# Patient Record
Sex: Male | Born: 1970 | Hispanic: Yes | Marital: Married | Smoking: Former smoker
Health system: Southern US, Community
[De-identification: ages and names within clinical notes are randomized; demographics above are authoritative.]

## PROBLEM LIST (undated history)

## (undated) DIAGNOSIS — M549 Dorsalgia, unspecified: Secondary | ICD-10-CM

## (undated) DIAGNOSIS — G8929 Other chronic pain: Secondary | ICD-10-CM

## (undated) DIAGNOSIS — R519 Headache, unspecified: Secondary | ICD-10-CM

## (undated) DIAGNOSIS — E162 Hypoglycemia, unspecified: Secondary | ICD-10-CM

## (undated) DIAGNOSIS — K635 Polyp of colon: Secondary | ICD-10-CM

## (undated) DIAGNOSIS — M797 Fibromyalgia: Secondary | ICD-10-CM

## (undated) DIAGNOSIS — G4730 Sleep apnea, unspecified: Secondary | ICD-10-CM

## (undated) DIAGNOSIS — Z86718 Personal history of other venous thrombosis and embolism: Secondary | ICD-10-CM

## (undated) DIAGNOSIS — M1990 Unspecified osteoarthritis, unspecified site: Secondary | ICD-10-CM

## (undated) DIAGNOSIS — R51 Headache: Secondary | ICD-10-CM

## (undated) DIAGNOSIS — F419 Anxiety disorder, unspecified: Secondary | ICD-10-CM

## (undated) HISTORY — DX: Polyp of colon: K63.5

## (undated) HISTORY — DX: Fibromyalgia: M79.7

## (undated) HISTORY — PX: THYROID SURGERY: SHX805

## (undated) HISTORY — PX: MENISCUS REPAIR: SHX5179

## (undated) HISTORY — DX: Other chronic pain: G89.29

## (undated) HISTORY — DX: Dorsalgia, unspecified: M54.9

## (undated) HISTORY — DX: Sleep apnea, unspecified: G47.30

## (undated) HISTORY — DX: Headache, unspecified: R51.9

## (undated) HISTORY — DX: Unspecified osteoarthritis, unspecified site: M19.90

## (undated) HISTORY — DX: Anxiety disorder, unspecified: F41.9

## (undated) HISTORY — DX: Headache: R51

## (undated) HISTORY — PX: VEIN SURGERY: SHX48

## (undated) HISTORY — DX: Hypoglycemia, unspecified: E16.2

## (undated) HISTORY — DX: Personal history of other venous thrombosis and embolism: Z86.718

---

## 2011-10-18 HISTORY — PX: GASTRIC BYPASS: SHX52

## 2014-01-17 DIAGNOSIS — M797 Fibromyalgia: Secondary | ICD-10-CM | POA: Insufficient documentation

## 2016-01-26 DIAGNOSIS — M5126 Other intervertebral disc displacement, lumbar region: Secondary | ICD-10-CM | POA: Diagnosis not present

## 2016-01-26 DIAGNOSIS — M461 Sacroiliitis, not elsewhere classified: Secondary | ICD-10-CM | POA: Diagnosis not present

## 2016-01-26 DIAGNOSIS — M5116 Intervertebral disc disorders with radiculopathy, lumbar region: Secondary | ICD-10-CM | POA: Diagnosis not present

## 2016-01-26 DIAGNOSIS — M5440 Lumbago with sciatica, unspecified side: Secondary | ICD-10-CM | POA: Diagnosis not present

## 2016-01-26 DIAGNOSIS — M5431 Sciatica, right side: Secondary | ICD-10-CM | POA: Diagnosis not present

## 2016-01-26 DIAGNOSIS — M545 Low back pain: Secondary | ICD-10-CM | POA: Diagnosis not present

## 2016-01-26 DIAGNOSIS — M5416 Radiculopathy, lumbar region: Secondary | ICD-10-CM | POA: Diagnosis not present

## 2016-01-26 DIAGNOSIS — G2581 Restless legs syndrome: Secondary | ICD-10-CM | POA: Diagnosis not present

## 2016-01-26 DIAGNOSIS — G43719 Chronic migraine without aura, intractable, without status migrainosus: Secondary | ICD-10-CM | POA: Diagnosis not present

## 2016-01-26 DIAGNOSIS — M47897 Other spondylosis, lumbosacral region: Secondary | ICD-10-CM | POA: Diagnosis not present

## 2016-01-26 DIAGNOSIS — M4807 Spinal stenosis, lumbosacral region: Secondary | ICD-10-CM | POA: Diagnosis not present

## 2016-02-11 DIAGNOSIS — M5126 Other intervertebral disc displacement, lumbar region: Secondary | ICD-10-CM | POA: Diagnosis not present

## 2016-02-11 DIAGNOSIS — M5440 Lumbago with sciatica, unspecified side: Secondary | ICD-10-CM | POA: Diagnosis not present

## 2016-02-11 DIAGNOSIS — M5116 Intervertebral disc disorders with radiculopathy, lumbar region: Secondary | ICD-10-CM | POA: Diagnosis not present

## 2016-02-11 DIAGNOSIS — G43909 Migraine, unspecified, not intractable, without status migrainosus: Secondary | ICD-10-CM | POA: Diagnosis not present

## 2016-02-11 DIAGNOSIS — M545 Low back pain: Secondary | ICD-10-CM | POA: Diagnosis not present

## 2016-02-11 DIAGNOSIS — M5416 Radiculopathy, lumbar region: Secondary | ICD-10-CM | POA: Diagnosis not present

## 2016-02-25 DIAGNOSIS — M5116 Intervertebral disc disorders with radiculopathy, lumbar region: Secondary | ICD-10-CM | POA: Diagnosis not present

## 2016-02-25 DIAGNOSIS — M5126 Other intervertebral disc displacement, lumbar region: Secondary | ICD-10-CM | POA: Diagnosis not present

## 2016-02-25 DIAGNOSIS — M5440 Lumbago with sciatica, unspecified side: Secondary | ICD-10-CM | POA: Diagnosis not present

## 2016-02-25 DIAGNOSIS — G43719 Chronic migraine without aura, intractable, without status migrainosus: Secondary | ICD-10-CM | POA: Diagnosis not present

## 2016-02-25 DIAGNOSIS — M47897 Other spondylosis, lumbosacral region: Secondary | ICD-10-CM | POA: Diagnosis not present

## 2016-02-25 DIAGNOSIS — M4807 Spinal stenosis, lumbosacral region: Secondary | ICD-10-CM | POA: Diagnosis not present

## 2016-02-25 DIAGNOSIS — G2581 Restless legs syndrome: Secondary | ICD-10-CM | POA: Diagnosis not present

## 2016-02-25 DIAGNOSIS — M545 Low back pain: Secondary | ICD-10-CM | POA: Diagnosis not present

## 2016-02-25 DIAGNOSIS — M5416 Radiculopathy, lumbar region: Secondary | ICD-10-CM | POA: Diagnosis not present

## 2016-02-25 DIAGNOSIS — M5431 Sciatica, right side: Secondary | ICD-10-CM | POA: Diagnosis not present

## 2016-02-25 DIAGNOSIS — M461 Sacroiliitis, not elsewhere classified: Secondary | ICD-10-CM | POA: Diagnosis not present

## 2016-03-11 DIAGNOSIS — Z86718 Personal history of other venous thrombosis and embolism: Secondary | ICD-10-CM | POA: Diagnosis not present

## 2016-03-11 DIAGNOSIS — Z8673 Personal history of transient ischemic attack (TIA), and cerebral infarction without residual deficits: Secondary | ICD-10-CM | POA: Diagnosis not present

## 2016-03-11 DIAGNOSIS — Z9884 Bariatric surgery status: Secondary | ICD-10-CM | POA: Diagnosis not present

## 2016-03-11 DIAGNOSIS — Z87891 Personal history of nicotine dependence: Secondary | ICD-10-CM | POA: Diagnosis not present

## 2016-03-11 DIAGNOSIS — E890 Postprocedural hypothyroidism: Secondary | ICD-10-CM | POA: Diagnosis not present

## 2016-03-11 DIAGNOSIS — E162 Hypoglycemia, unspecified: Secondary | ICD-10-CM | POA: Diagnosis not present

## 2016-03-17 DIAGNOSIS — M542 Cervicalgia: Secondary | ICD-10-CM | POA: Diagnosis not present

## 2016-03-17 DIAGNOSIS — M50223 Other cervical disc displacement at C6-C7 level: Secondary | ICD-10-CM | POA: Diagnosis not present

## 2016-03-17 DIAGNOSIS — M50323 Other cervical disc degeneration at C6-C7 level: Secondary | ICD-10-CM | POA: Diagnosis not present

## 2016-03-17 DIAGNOSIS — M50221 Other cervical disc displacement at C4-C5 level: Secondary | ICD-10-CM | POA: Diagnosis not present

## 2016-03-17 DIAGNOSIS — M5021 Other cervical disc displacement,  high cervical region: Secondary | ICD-10-CM | POA: Diagnosis not present

## 2016-03-17 DIAGNOSIS — M545 Low back pain: Secondary | ICD-10-CM | POA: Diagnosis not present

## 2016-03-21 DIAGNOSIS — Z6841 Body Mass Index (BMI) 40.0 and over, adult: Secondary | ICD-10-CM | POA: Diagnosis not present

## 2016-03-21 DIAGNOSIS — E559 Vitamin D deficiency, unspecified: Secondary | ICD-10-CM | POA: Diagnosis not present

## 2016-03-21 DIAGNOSIS — M797 Fibromyalgia: Secondary | ICD-10-CM | POA: Diagnosis not present

## 2016-03-22 DIAGNOSIS — G43719 Chronic migraine without aura, intractable, without status migrainosus: Secondary | ICD-10-CM | POA: Diagnosis not present

## 2016-03-22 DIAGNOSIS — M545 Low back pain: Secondary | ICD-10-CM | POA: Diagnosis not present

## 2016-03-22 DIAGNOSIS — M50323 Other cervical disc degeneration at C6-C7 level: Secondary | ICD-10-CM | POA: Diagnosis not present

## 2016-03-22 DIAGNOSIS — M50123 Cervical disc disorder at C6-C7 level with radiculopathy: Secondary | ICD-10-CM | POA: Diagnosis not present

## 2016-03-22 DIAGNOSIS — M5126 Other intervertebral disc displacement, lumbar region: Secondary | ICD-10-CM | POA: Diagnosis not present

## 2016-03-22 DIAGNOSIS — M461 Sacroiliitis, not elsewhere classified: Secondary | ICD-10-CM | POA: Diagnosis not present

## 2016-03-22 DIAGNOSIS — G2581 Restless legs syndrome: Secondary | ICD-10-CM | POA: Diagnosis not present

## 2016-03-22 DIAGNOSIS — M5416 Radiculopathy, lumbar region: Secondary | ICD-10-CM | POA: Diagnosis not present

## 2016-03-22 DIAGNOSIS — M47897 Other spondylosis, lumbosacral region: Secondary | ICD-10-CM | POA: Diagnosis not present

## 2016-03-22 DIAGNOSIS — M5116 Intervertebral disc disorders with radiculopathy, lumbar region: Secondary | ICD-10-CM | POA: Diagnosis not present

## 2016-03-22 DIAGNOSIS — M5440 Lumbago with sciatica, unspecified side: Secondary | ICD-10-CM | POA: Diagnosis not present

## 2016-03-22 DIAGNOSIS — M542 Cervicalgia: Secondary | ICD-10-CM | POA: Diagnosis not present

## 2016-03-31 DIAGNOSIS — I10 Essential (primary) hypertension: Secondary | ICD-10-CM | POA: Diagnosis not present

## 2016-03-31 DIAGNOSIS — Z0000 Encounter for general adult medical examination without abnormal findings: Secondary | ICD-10-CM | POA: Diagnosis not present

## 2016-04-26 DIAGNOSIS — E669 Obesity, unspecified: Secondary | ICD-10-CM | POA: Diagnosis not present

## 2016-04-26 DIAGNOSIS — E538 Deficiency of other specified B group vitamins: Secondary | ICD-10-CM | POA: Diagnosis not present

## 2016-04-26 DIAGNOSIS — E162 Hypoglycemia, unspecified: Secondary | ICD-10-CM | POA: Diagnosis not present

## 2016-04-26 DIAGNOSIS — E539 Vitamin B deficiency, unspecified: Secondary | ICD-10-CM | POA: Diagnosis not present

## 2016-04-27 DIAGNOSIS — R7301 Impaired fasting glucose: Secondary | ICD-10-CM | POA: Diagnosis not present

## 2016-04-27 DIAGNOSIS — M79673 Pain in unspecified foot: Secondary | ICD-10-CM | POA: Diagnosis not present

## 2016-04-27 DIAGNOSIS — G2581 Restless legs syndrome: Secondary | ICD-10-CM | POA: Diagnosis not present

## 2016-04-27 DIAGNOSIS — E784 Other hyperlipidemia: Secondary | ICD-10-CM | POA: Diagnosis not present

## 2016-04-27 DIAGNOSIS — I10 Essential (primary) hypertension: Secondary | ICD-10-CM | POA: Diagnosis not present

## 2016-04-27 DIAGNOSIS — M50323 Other cervical disc degeneration at C6-C7 level: Secondary | ICD-10-CM | POA: Diagnosis not present

## 2016-04-27 DIAGNOSIS — M461 Sacroiliitis, not elsewhere classified: Secondary | ICD-10-CM | POA: Diagnosis not present

## 2016-04-27 DIAGNOSIS — M50123 Cervical disc disorder at C6-C7 level with radiculopathy: Secondary | ICD-10-CM | POA: Diagnosis not present

## 2016-04-27 DIAGNOSIS — M5126 Other intervertebral disc displacement, lumbar region: Secondary | ICD-10-CM | POA: Diagnosis not present

## 2016-04-27 DIAGNOSIS — M5440 Lumbago with sciatica, unspecified side: Secondary | ICD-10-CM | POA: Diagnosis not present

## 2016-04-27 DIAGNOSIS — M5416 Radiculopathy, lumbar region: Secondary | ICD-10-CM | POA: Diagnosis not present

## 2016-04-27 DIAGNOSIS — M545 Low back pain: Secondary | ICD-10-CM | POA: Diagnosis not present

## 2016-04-27 DIAGNOSIS — M47897 Other spondylosis, lumbosacral region: Secondary | ICD-10-CM | POA: Diagnosis not present

## 2016-04-27 DIAGNOSIS — M7731 Calcaneal spur, right foot: Secondary | ICD-10-CM | POA: Diagnosis not present

## 2016-04-27 DIAGNOSIS — M5116 Intervertebral disc disorders with radiculopathy, lumbar region: Secondary | ICD-10-CM | POA: Diagnosis not present

## 2016-04-27 DIAGNOSIS — G43719 Chronic migraine without aura, intractable, without status migrainosus: Secondary | ICD-10-CM | POA: Diagnosis not present

## 2016-04-27 DIAGNOSIS — M542 Cervicalgia: Secondary | ICD-10-CM | POA: Diagnosis not present

## 2016-04-27 DIAGNOSIS — E559 Vitamin D deficiency, unspecified: Secondary | ICD-10-CM | POA: Diagnosis not present

## 2016-04-27 DIAGNOSIS — D519 Vitamin B12 deficiency anemia, unspecified: Secondary | ICD-10-CM | POA: Diagnosis not present

## 2016-04-27 DIAGNOSIS — R5383 Other fatigue: Secondary | ICD-10-CM | POA: Diagnosis not present

## 2016-04-27 DIAGNOSIS — M7732 Calcaneal spur, left foot: Secondary | ICD-10-CM | POA: Diagnosis not present

## 2016-05-05 DIAGNOSIS — S134XXA Sprain of ligaments of cervical spine, initial encounter: Secondary | ICD-10-CM | POA: Diagnosis not present

## 2016-05-05 DIAGNOSIS — G43909 Migraine, unspecified, not intractable, without status migrainosus: Secondary | ICD-10-CM | POA: Diagnosis not present

## 2016-05-05 DIAGNOSIS — Z87828 Personal history of other (healed) physical injury and trauma: Secondary | ICD-10-CM | POA: Diagnosis not present

## 2016-05-05 DIAGNOSIS — M4722 Other spondylosis with radiculopathy, cervical region: Secondary | ICD-10-CM | POA: Diagnosis not present

## 2016-05-05 DIAGNOSIS — Z833 Family history of diabetes mellitus: Secondary | ICD-10-CM | POA: Diagnosis not present

## 2016-05-05 DIAGNOSIS — M542 Cervicalgia: Secondary | ICD-10-CM | POA: Diagnosis not present

## 2016-05-05 DIAGNOSIS — Z87891 Personal history of nicotine dependence: Secondary | ICD-10-CM | POA: Diagnosis not present

## 2016-05-05 DIAGNOSIS — M1288 Other specific arthropathies, not elsewhere classified, other specified site: Secondary | ICD-10-CM | POA: Diagnosis not present

## 2016-05-18 DIAGNOSIS — M4602 Spinal enthesopathy, cervical region: Secondary | ICD-10-CM | POA: Diagnosis not present

## 2016-05-18 DIAGNOSIS — M5481 Occipital neuralgia: Secondary | ICD-10-CM | POA: Diagnosis not present

## 2016-05-18 DIAGNOSIS — M542 Cervicalgia: Secondary | ICD-10-CM | POA: Diagnosis not present

## 2016-05-18 DIAGNOSIS — M5382 Other specified dorsopathies, cervical region: Secondary | ICD-10-CM | POA: Diagnosis not present

## 2016-05-19 DIAGNOSIS — E049 Nontoxic goiter, unspecified: Secondary | ICD-10-CM | POA: Diagnosis not present

## 2016-05-19 DIAGNOSIS — M79671 Pain in right foot: Secondary | ICD-10-CM | POA: Diagnosis not present

## 2016-05-19 DIAGNOSIS — I83813 Varicose veins of bilateral lower extremities with pain: Secondary | ICD-10-CM | POA: Diagnosis not present

## 2016-05-19 DIAGNOSIS — M797 Fibromyalgia: Secondary | ICD-10-CM | POA: Diagnosis not present

## 2016-05-19 DIAGNOSIS — E041 Nontoxic single thyroid nodule: Secondary | ICD-10-CM | POA: Diagnosis not present

## 2016-05-30 DIAGNOSIS — E049 Nontoxic goiter, unspecified: Secondary | ICD-10-CM | POA: Diagnosis not present

## 2016-05-30 DIAGNOSIS — R5383 Other fatigue: Secondary | ICD-10-CM | POA: Diagnosis not present

## 2016-06-02 DIAGNOSIS — G43719 Chronic migraine without aura, intractable, without status migrainosus: Secondary | ICD-10-CM | POA: Diagnosis not present

## 2016-06-02 DIAGNOSIS — M542 Cervicalgia: Secondary | ICD-10-CM | POA: Diagnosis not present

## 2016-06-02 DIAGNOSIS — M5416 Radiculopathy, lumbar region: Secondary | ICD-10-CM | POA: Diagnosis not present

## 2016-06-02 DIAGNOSIS — M50323 Other cervical disc degeneration at C6-C7 level: Secondary | ICD-10-CM | POA: Diagnosis not present

## 2016-06-02 DIAGNOSIS — G2581 Restless legs syndrome: Secondary | ICD-10-CM | POA: Diagnosis not present

## 2016-06-02 DIAGNOSIS — M461 Sacroiliitis, not elsewhere classified: Secondary | ICD-10-CM | POA: Diagnosis not present

## 2016-06-02 DIAGNOSIS — M545 Low back pain: Secondary | ICD-10-CM | POA: Diagnosis not present

## 2016-06-02 DIAGNOSIS — M5440 Lumbago with sciatica, unspecified side: Secondary | ICD-10-CM | POA: Diagnosis not present

## 2016-06-02 DIAGNOSIS — M50123 Cervical disc disorder at C6-C7 level with radiculopathy: Secondary | ICD-10-CM | POA: Diagnosis not present

## 2016-06-02 DIAGNOSIS — M47897 Other spondylosis, lumbosacral region: Secondary | ICD-10-CM | POA: Diagnosis not present

## 2016-06-02 DIAGNOSIS — M5126 Other intervertebral disc displacement, lumbar region: Secondary | ICD-10-CM | POA: Diagnosis not present

## 2016-06-02 DIAGNOSIS — M5116 Intervertebral disc disorders with radiculopathy, lumbar region: Secondary | ICD-10-CM | POA: Diagnosis not present

## 2016-06-16 DIAGNOSIS — M5407 Panniculitis affecting regions of neck and back, lumbosacral region: Secondary | ICD-10-CM | POA: Diagnosis not present

## 2016-06-16 DIAGNOSIS — M545 Low back pain: Secondary | ICD-10-CM | POA: Diagnosis not present

## 2016-06-16 DIAGNOSIS — S335XXA Sprain of ligaments of lumbar spine, initial encounter: Secondary | ICD-10-CM | POA: Diagnosis not present

## 2016-06-16 DIAGNOSIS — G43909 Migraine, unspecified, not intractable, without status migrainosus: Secondary | ICD-10-CM | POA: Diagnosis not present

## 2016-06-16 DIAGNOSIS — M62830 Muscle spasm of back: Secondary | ICD-10-CM | POA: Diagnosis not present

## 2016-06-16 DIAGNOSIS — M4606 Spinal enthesopathy, lumbar region: Secondary | ICD-10-CM | POA: Diagnosis not present

## 2016-06-16 DIAGNOSIS — M4607 Spinal enthesopathy, lumbosacral region: Secondary | ICD-10-CM | POA: Diagnosis not present

## 2016-06-16 DIAGNOSIS — Z87891 Personal history of nicotine dependence: Secondary | ICD-10-CM | POA: Diagnosis not present

## 2016-06-16 DIAGNOSIS — M5387 Other specified dorsopathies, lumbosacral region: Secondary | ICD-10-CM | POA: Diagnosis not present

## 2016-06-16 DIAGNOSIS — M47897 Other spondylosis, lumbosacral region: Secondary | ICD-10-CM | POA: Diagnosis not present

## 2016-06-16 DIAGNOSIS — Z833 Family history of diabetes mellitus: Secondary | ICD-10-CM | POA: Diagnosis not present

## 2016-06-16 DIAGNOSIS — M791 Myalgia: Secondary | ICD-10-CM | POA: Diagnosis not present

## 2016-06-16 DIAGNOSIS — M5386 Other specified dorsopathies, lumbar region: Secondary | ICD-10-CM | POA: Diagnosis not present

## 2016-06-20 DIAGNOSIS — M722 Plantar fascial fibromatosis: Secondary | ICD-10-CM | POA: Diagnosis not present

## 2016-06-20 DIAGNOSIS — M79672 Pain in left foot: Secondary | ICD-10-CM | POA: Diagnosis not present

## 2016-06-20 DIAGNOSIS — M79671 Pain in right foot: Secondary | ICD-10-CM | POA: Diagnosis not present

## 2016-06-20 DIAGNOSIS — M7751 Other enthesopathy of right foot: Secondary | ICD-10-CM | POA: Diagnosis not present

## 2016-06-20 DIAGNOSIS — M7752 Other enthesopathy of left foot: Secondary | ICD-10-CM | POA: Diagnosis not present

## 2016-06-20 DIAGNOSIS — R600 Localized edema: Secondary | ICD-10-CM | POA: Diagnosis not present

## 2016-06-21 DIAGNOSIS — M79672 Pain in left foot: Secondary | ICD-10-CM | POA: Diagnosis not present

## 2016-06-21 DIAGNOSIS — M79671 Pain in right foot: Secondary | ICD-10-CM | POA: Diagnosis not present

## 2016-06-21 DIAGNOSIS — M722 Plantar fascial fibromatosis: Secondary | ICD-10-CM | POA: Diagnosis not present

## 2016-06-21 DIAGNOSIS — Z7409 Other reduced mobility: Secondary | ICD-10-CM | POA: Diagnosis not present

## 2016-06-21 DIAGNOSIS — R269 Unspecified abnormalities of gait and mobility: Secondary | ICD-10-CM | POA: Diagnosis not present

## 2016-06-24 DIAGNOSIS — R269 Unspecified abnormalities of gait and mobility: Secondary | ICD-10-CM | POA: Diagnosis not present

## 2016-06-24 DIAGNOSIS — M722 Plantar fascial fibromatosis: Secondary | ICD-10-CM | POA: Diagnosis not present

## 2016-06-24 DIAGNOSIS — M79672 Pain in left foot: Secondary | ICD-10-CM | POA: Diagnosis not present

## 2016-06-24 DIAGNOSIS — M79671 Pain in right foot: Secondary | ICD-10-CM | POA: Diagnosis not present

## 2016-06-24 DIAGNOSIS — Z7409 Other reduced mobility: Secondary | ICD-10-CM | POA: Diagnosis not present

## 2016-06-29 DIAGNOSIS — I10 Essential (primary) hypertension: Secondary | ICD-10-CM | POA: Diagnosis not present

## 2016-06-29 DIAGNOSIS — E559 Vitamin D deficiency, unspecified: Secondary | ICD-10-CM | POA: Diagnosis not present

## 2016-06-30 DIAGNOSIS — M461 Sacroiliitis, not elsewhere classified: Secondary | ICD-10-CM | POA: Diagnosis not present

## 2016-06-30 DIAGNOSIS — G2581 Restless legs syndrome: Secondary | ICD-10-CM | POA: Diagnosis not present

## 2016-06-30 DIAGNOSIS — M50323 Other cervical disc degeneration at C6-C7 level: Secondary | ICD-10-CM | POA: Diagnosis not present

## 2016-06-30 DIAGNOSIS — M50123 Cervical disc disorder at C6-C7 level with radiculopathy: Secondary | ICD-10-CM | POA: Diagnosis not present

## 2016-06-30 DIAGNOSIS — M5116 Intervertebral disc disorders with radiculopathy, lumbar region: Secondary | ICD-10-CM | POA: Diagnosis not present

## 2016-06-30 DIAGNOSIS — M542 Cervicalgia: Secondary | ICD-10-CM | POA: Diagnosis not present

## 2016-06-30 DIAGNOSIS — G43719 Chronic migraine without aura, intractable, without status migrainosus: Secondary | ICD-10-CM | POA: Diagnosis not present

## 2016-06-30 DIAGNOSIS — M5126 Other intervertebral disc displacement, lumbar region: Secondary | ICD-10-CM | POA: Diagnosis not present

## 2016-06-30 DIAGNOSIS — M545 Low back pain: Secondary | ICD-10-CM | POA: Diagnosis not present

## 2016-06-30 DIAGNOSIS — R42 Dizziness and giddiness: Secondary | ICD-10-CM | POA: Diagnosis not present

## 2016-06-30 DIAGNOSIS — M47897 Other spondylosis, lumbosacral region: Secondary | ICD-10-CM | POA: Diagnosis not present

## 2016-06-30 DIAGNOSIS — M5416 Radiculopathy, lumbar region: Secondary | ICD-10-CM | POA: Diagnosis not present

## 2016-06-30 DIAGNOSIS — M5440 Lumbago with sciatica, unspecified side: Secondary | ICD-10-CM | POA: Diagnosis not present

## 2016-07-11 DIAGNOSIS — M79671 Pain in right foot: Secondary | ICD-10-CM | POA: Diagnosis not present

## 2016-07-11 DIAGNOSIS — M7751 Other enthesopathy of right foot: Secondary | ICD-10-CM | POA: Diagnosis not present

## 2016-07-11 DIAGNOSIS — M79672 Pain in left foot: Secondary | ICD-10-CM | POA: Diagnosis not present

## 2016-07-11 DIAGNOSIS — M722 Plantar fascial fibromatosis: Secondary | ICD-10-CM | POA: Diagnosis not present

## 2016-07-11 DIAGNOSIS — M7752 Other enthesopathy of left foot: Secondary | ICD-10-CM | POA: Diagnosis not present

## 2016-07-11 DIAGNOSIS — R600 Localized edema: Secondary | ICD-10-CM | POA: Diagnosis not present

## 2016-07-11 DIAGNOSIS — R262 Difficulty in walking, not elsewhere classified: Secondary | ICD-10-CM | POA: Diagnosis not present

## 2016-07-29 DIAGNOSIS — M50123 Cervical disc disorder at C6-C7 level with radiculopathy: Secondary | ICD-10-CM | POA: Diagnosis not present

## 2016-07-29 DIAGNOSIS — M545 Low back pain: Secondary | ICD-10-CM | POA: Diagnosis not present

## 2016-07-29 DIAGNOSIS — M5440 Lumbago with sciatica, unspecified side: Secondary | ICD-10-CM | POA: Diagnosis not present

## 2016-07-29 DIAGNOSIS — M461 Sacroiliitis, not elsewhere classified: Secondary | ICD-10-CM | POA: Diagnosis not present

## 2016-07-29 DIAGNOSIS — G43719 Chronic migraine without aura, intractable, without status migrainosus: Secondary | ICD-10-CM | POA: Diagnosis not present

## 2016-07-29 DIAGNOSIS — M5126 Other intervertebral disc displacement, lumbar region: Secondary | ICD-10-CM | POA: Diagnosis not present

## 2016-07-29 DIAGNOSIS — M5416 Radiculopathy, lumbar region: Secondary | ICD-10-CM | POA: Diagnosis not present

## 2016-07-29 DIAGNOSIS — G2581 Restless legs syndrome: Secondary | ICD-10-CM | POA: Diagnosis not present

## 2016-07-29 DIAGNOSIS — M50323 Other cervical disc degeneration at C6-C7 level: Secondary | ICD-10-CM | POA: Diagnosis not present

## 2016-07-29 DIAGNOSIS — M5116 Intervertebral disc disorders with radiculopathy, lumbar region: Secondary | ICD-10-CM | POA: Diagnosis not present

## 2016-07-29 DIAGNOSIS — M542 Cervicalgia: Secondary | ICD-10-CM | POA: Diagnosis not present

## 2016-07-29 DIAGNOSIS — M47897 Other spondylosis, lumbosacral region: Secondary | ICD-10-CM | POA: Diagnosis not present

## 2016-08-03 DIAGNOSIS — M5126 Other intervertebral disc displacement, lumbar region: Secondary | ICD-10-CM | POA: Diagnosis not present

## 2016-08-03 DIAGNOSIS — M545 Low back pain: Secondary | ICD-10-CM | POA: Diagnosis not present

## 2016-08-03 DIAGNOSIS — M5116 Intervertebral disc disorders with radiculopathy, lumbar region: Secondary | ICD-10-CM | POA: Diagnosis not present

## 2016-08-03 DIAGNOSIS — M5440 Lumbago with sciatica, unspecified side: Secondary | ICD-10-CM | POA: Diagnosis not present

## 2016-08-03 DIAGNOSIS — M5416 Radiculopathy, lumbar region: Secondary | ICD-10-CM | POA: Diagnosis not present

## 2016-08-08 DIAGNOSIS — Z206 Contact with and (suspected) exposure to human immunodeficiency virus [HIV]: Secondary | ICD-10-CM | POA: Diagnosis not present

## 2016-08-08 DIAGNOSIS — Z202 Contact with and (suspected) exposure to infections with a predominantly sexual mode of transmission: Secondary | ICD-10-CM | POA: Diagnosis not present

## 2016-08-11 DIAGNOSIS — Z7251 High risk heterosexual behavior: Secondary | ICD-10-CM | POA: Diagnosis not present

## 2016-08-29 DIAGNOSIS — M5126 Other intervertebral disc displacement, lumbar region: Secondary | ICD-10-CM | POA: Diagnosis not present

## 2016-08-29 DIAGNOSIS — M5431 Sciatica, right side: Secondary | ICD-10-CM | POA: Diagnosis not present

## 2016-08-29 DIAGNOSIS — M461 Sacroiliitis, not elsewhere classified: Secondary | ICD-10-CM | POA: Diagnosis not present

## 2016-08-29 DIAGNOSIS — M542 Cervicalgia: Secondary | ICD-10-CM | POA: Diagnosis not present

## 2016-08-29 DIAGNOSIS — M50123 Cervical disc disorder at C6-C7 level with radiculopathy: Secondary | ICD-10-CM | POA: Diagnosis not present

## 2016-08-29 DIAGNOSIS — M47897 Other spondylosis, lumbosacral region: Secondary | ICD-10-CM | POA: Diagnosis not present

## 2016-08-29 DIAGNOSIS — M50323 Other cervical disc degeneration at C6-C7 level: Secondary | ICD-10-CM | POA: Diagnosis not present

## 2016-08-29 DIAGNOSIS — M5416 Radiculopathy, lumbar region: Secondary | ICD-10-CM | POA: Diagnosis not present

## 2016-08-29 DIAGNOSIS — G2581 Restless legs syndrome: Secondary | ICD-10-CM | POA: Diagnosis not present

## 2016-08-29 DIAGNOSIS — G43719 Chronic migraine without aura, intractable, without status migrainosus: Secondary | ICD-10-CM | POA: Diagnosis not present

## 2016-08-29 DIAGNOSIS — M5116 Intervertebral disc disorders with radiculopathy, lumbar region: Secondary | ICD-10-CM | POA: Diagnosis not present

## 2016-08-29 DIAGNOSIS — M5440 Lumbago with sciatica, unspecified side: Secondary | ICD-10-CM | POA: Diagnosis not present

## 2016-09-21 DIAGNOSIS — R51 Headache: Secondary | ICD-10-CM | POA: Diagnosis not present

## 2016-09-21 DIAGNOSIS — R200 Anesthesia of skin: Secondary | ICD-10-CM | POA: Diagnosis not present

## 2016-09-21 DIAGNOSIS — R079 Chest pain, unspecified: Secondary | ICD-10-CM | POA: Diagnosis not present

## 2016-09-21 DIAGNOSIS — Z87891 Personal history of nicotine dependence: Secondary | ICD-10-CM | POA: Diagnosis not present

## 2016-09-21 DIAGNOSIS — R0789 Other chest pain: Secondary | ICD-10-CM | POA: Diagnosis not present

## 2016-09-21 DIAGNOSIS — Z9884 Bariatric surgery status: Secondary | ICD-10-CM | POA: Diagnosis not present

## 2016-09-28 DIAGNOSIS — M542 Cervicalgia: Secondary | ICD-10-CM | POA: Diagnosis not present

## 2016-09-28 DIAGNOSIS — M50323 Other cervical disc degeneration at C6-C7 level: Secondary | ICD-10-CM | POA: Diagnosis not present

## 2016-09-28 DIAGNOSIS — M5116 Intervertebral disc disorders with radiculopathy, lumbar region: Secondary | ICD-10-CM | POA: Diagnosis not present

## 2016-09-28 DIAGNOSIS — M5126 Other intervertebral disc displacement, lumbar region: Secondary | ICD-10-CM | POA: Diagnosis not present

## 2016-09-28 DIAGNOSIS — M5431 Sciatica, right side: Secondary | ICD-10-CM | POA: Diagnosis not present

## 2016-09-28 DIAGNOSIS — G2581 Restless legs syndrome: Secondary | ICD-10-CM | POA: Diagnosis not present

## 2016-09-28 DIAGNOSIS — M4807 Spinal stenosis, lumbosacral region: Secondary | ICD-10-CM | POA: Diagnosis not present

## 2016-09-28 DIAGNOSIS — M47897 Other spondylosis, lumbosacral region: Secondary | ICD-10-CM | POA: Diagnosis not present

## 2016-09-28 DIAGNOSIS — M5440 Lumbago with sciatica, unspecified side: Secondary | ICD-10-CM | POA: Diagnosis not present

## 2016-09-28 DIAGNOSIS — G43719 Chronic migraine without aura, intractable, without status migrainosus: Secondary | ICD-10-CM | POA: Diagnosis not present

## 2016-09-28 DIAGNOSIS — M461 Sacroiliitis, not elsewhere classified: Secondary | ICD-10-CM | POA: Diagnosis not present

## 2016-09-28 DIAGNOSIS — M50123 Cervical disc disorder at C6-C7 level with radiculopathy: Secondary | ICD-10-CM | POA: Diagnosis not present

## 2016-10-12 DIAGNOSIS — R1013 Epigastric pain: Secondary | ICD-10-CM | POA: Diagnosis not present

## 2016-10-12 DIAGNOSIS — E669 Obesity, unspecified: Secondary | ICD-10-CM | POA: Diagnosis not present

## 2016-10-12 DIAGNOSIS — R251 Tremor, unspecified: Secondary | ICD-10-CM | POA: Diagnosis not present

## 2016-10-14 DIAGNOSIS — H31001 Unspecified chorioretinal scars, right eye: Secondary | ICD-10-CM | POA: Diagnosis not present

## 2016-10-14 DIAGNOSIS — H53483 Generalized contraction of visual field, bilateral: Secondary | ICD-10-CM | POA: Diagnosis not present

## 2016-10-14 DIAGNOSIS — R1013 Epigastric pain: Secondary | ICD-10-CM | POA: Diagnosis not present

## 2016-10-14 DIAGNOSIS — R162 Hepatomegaly with splenomegaly, not elsewhere classified: Secondary | ICD-10-CM | POA: Diagnosis not present

## 2016-10-28 DIAGNOSIS — G43719 Chronic migraine without aura, intractable, without status migrainosus: Secondary | ICD-10-CM | POA: Diagnosis not present

## 2016-10-28 DIAGNOSIS — G2581 Restless legs syndrome: Secondary | ICD-10-CM | POA: Diagnosis not present

## 2016-10-28 DIAGNOSIS — M50323 Other cervical disc degeneration at C6-C7 level: Secondary | ICD-10-CM | POA: Diagnosis not present

## 2016-10-28 DIAGNOSIS — M4807 Spinal stenosis, lumbosacral region: Secondary | ICD-10-CM | POA: Diagnosis not present

## 2016-10-28 DIAGNOSIS — M5431 Sciatica, right side: Secondary | ICD-10-CM | POA: Diagnosis not present

## 2016-10-28 DIAGNOSIS — M47897 Other spondylosis, lumbosacral region: Secondary | ICD-10-CM | POA: Diagnosis not present

## 2016-10-28 DIAGNOSIS — M50123 Cervical disc disorder at C6-C7 level with radiculopathy: Secondary | ICD-10-CM | POA: Diagnosis not present

## 2016-10-28 DIAGNOSIS — M461 Sacroiliitis, not elsewhere classified: Secondary | ICD-10-CM | POA: Diagnosis not present

## 2016-10-28 DIAGNOSIS — M5116 Intervertebral disc disorders with radiculopathy, lumbar region: Secondary | ICD-10-CM | POA: Diagnosis not present

## 2016-10-28 DIAGNOSIS — M5126 Other intervertebral disc displacement, lumbar region: Secondary | ICD-10-CM | POA: Diagnosis not present

## 2016-11-28 DIAGNOSIS — M5431 Sciatica, right side: Secondary | ICD-10-CM | POA: Diagnosis not present

## 2016-11-28 DIAGNOSIS — G43719 Chronic migraine without aura, intractable, without status migrainosus: Secondary | ICD-10-CM | POA: Diagnosis not present

## 2016-11-28 DIAGNOSIS — M461 Sacroiliitis, not elsewhere classified: Secondary | ICD-10-CM | POA: Diagnosis not present

## 2016-11-28 DIAGNOSIS — M5116 Intervertebral disc disorders with radiculopathy, lumbar region: Secondary | ICD-10-CM | POA: Diagnosis not present

## 2016-11-28 DIAGNOSIS — M542 Cervicalgia: Secondary | ICD-10-CM | POA: Diagnosis not present

## 2016-11-28 DIAGNOSIS — M50323 Other cervical disc degeneration at C6-C7 level: Secondary | ICD-10-CM | POA: Diagnosis not present

## 2016-11-28 DIAGNOSIS — G2581 Restless legs syndrome: Secondary | ICD-10-CM | POA: Diagnosis not present

## 2016-11-28 DIAGNOSIS — M5126 Other intervertebral disc displacement, lumbar region: Secondary | ICD-10-CM | POA: Diagnosis not present

## 2016-11-28 DIAGNOSIS — M50123 Cervical disc disorder at C6-C7 level with radiculopathy: Secondary | ICD-10-CM | POA: Diagnosis not present

## 2016-11-28 DIAGNOSIS — M47897 Other spondylosis, lumbosacral region: Secondary | ICD-10-CM | POA: Diagnosis not present

## 2016-11-28 DIAGNOSIS — M4807 Spinal stenosis, lumbosacral region: Secondary | ICD-10-CM | POA: Diagnosis not present

## 2016-12-29 DIAGNOSIS — M5116 Intervertebral disc disorders with radiculopathy, lumbar region: Secondary | ICD-10-CM | POA: Diagnosis not present

## 2016-12-29 DIAGNOSIS — M5431 Sciatica, right side: Secondary | ICD-10-CM | POA: Diagnosis not present

## 2016-12-29 DIAGNOSIS — Z79899 Other long term (current) drug therapy: Secondary | ICD-10-CM | POA: Diagnosis not present

## 2016-12-29 DIAGNOSIS — G43719 Chronic migraine without aura, intractable, without status migrainosus: Secondary | ICD-10-CM | POA: Diagnosis not present

## 2016-12-29 DIAGNOSIS — M545 Low back pain: Secondary | ICD-10-CM | POA: Diagnosis not present

## 2016-12-29 DIAGNOSIS — M5416 Radiculopathy, lumbar region: Secondary | ICD-10-CM | POA: Diagnosis not present

## 2016-12-29 DIAGNOSIS — Z5181 Encounter for therapeutic drug level monitoring: Secondary | ICD-10-CM | POA: Diagnosis not present

## 2016-12-29 DIAGNOSIS — M4807 Spinal stenosis, lumbosacral region: Secondary | ICD-10-CM | POA: Diagnosis not present

## 2016-12-29 DIAGNOSIS — M50123 Cervical disc disorder at C6-C7 level with radiculopathy: Secondary | ICD-10-CM | POA: Diagnosis not present

## 2017-03-01 ENCOUNTER — Other Ambulatory Visit: Payer: Self-pay

## 2017-03-01 ENCOUNTER — Ambulatory Visit (INDEPENDENT_AMBULATORY_CARE_PROVIDER_SITE_OTHER): Payer: Medicare Other | Admitting: Nurse Practitioner

## 2017-03-01 ENCOUNTER — Encounter: Payer: Self-pay | Admitting: Nurse Practitioner

## 2017-03-01 ENCOUNTER — Telehealth: Payer: Self-pay

## 2017-03-01 VITALS — BP 122/82 | HR 67 | Temp 98.4°F | Ht 69.75 in | Wt 289.8 lb

## 2017-03-01 DIAGNOSIS — G8929 Other chronic pain: Secondary | ICD-10-CM | POA: Diagnosis not present

## 2017-03-01 DIAGNOSIS — K635 Polyp of colon: Secondary | ICD-10-CM

## 2017-03-01 DIAGNOSIS — M5441 Lumbago with sciatica, right side: Secondary | ICD-10-CM | POA: Diagnosis not present

## 2017-03-01 DIAGNOSIS — Z860100 Personal history of colon polyps, unspecified: Secondary | ICD-10-CM

## 2017-03-01 DIAGNOSIS — M5442 Lumbago with sciatica, left side: Secondary | ICD-10-CM

## 2017-03-01 DIAGNOSIS — G43711 Chronic migraine without aura, intractable, with status migrainosus: Secondary | ICD-10-CM

## 2017-03-01 MED ORDER — CYCLOBENZAPRINE HCL 10 MG PO TABS: 10.0000 mg | ORAL_TABLET | Freq: Three times a day (TID) | ORAL | 2 refills | Status: DC

## 2017-03-01 MED ORDER — DULOXETINE HCL 30 MG PO CPEP: 30.0000 mg | ORAL_CAPSULE | Freq: Every day | ORAL | 2 refills | Status: DC

## 2017-03-01 NOTE — Patient Instructions (Addendum)
[name], Thank you for coming in to clinic today.  1. Keep headache diary before neurology.   2. You will get phone calls for referrals for GI: colonoscopy, [facility], [facility].  3. START cyclobenzaprine 10 mg tid for muscle spasms. 4. START duloxetine 30 mg once daily for fibromyalgia and musculoskeletal pain control.  Please schedule a follow-up appointment with Lance Carr, AGNP. Return in about 3 months (around 05/29/2017) for headaches, fibromyalgia.  If you have any other questions or concerns, please feel free to call the clinic or send a message through MyChart. You may also schedule an earlier appointment if necessary.  You will receive a survey after today's visit either digitally by e-mail or paper by [employer] mail. Your experiences and feedback matter to us.  Please respond so we know how we are doing as we provide care for you.   Lance McardleLauren Lavance Beazer, DNP, AGNP-BC Adult Gerontology Nurse Practitioner [facility], [facility]

## 2017-03-01 NOTE — Telephone Encounter (Signed)
Gastroenterology Pre-Procedure Review  Request Date: 03/16/17 Requesting Physician: Dr. Tobi BastosAnna  PATIENT REVIEW QUESTIONS: The patient responded to the following health history questions as indicated:    1. Are you having any GI issues? no 2. Do you have a personal history of Polyps? yes (3 years ago) 3. Do you have a family history of Colon Cancer or Polyps? yes (Uncle Colon Cancer) 4. Diabetes Mellitus? no 5. Joint replacements in the past 12 months?no 6. Major health problems in the past 3 months?no 7. Any artificial heart valves, MVP, or defibrillator?no    MEDICATIONS & ALLERGIES:    Patient reports the following regarding taking any anticoagulation/antiplatelet therapy:   Plavix, Coumadin, Eliquis, Xarelto, Lovenox, Pradaxa, Brilinta, or Effient? no Aspirin? no  Patient confirms/reports the following medications:  Current Outpatient Medications  Medication Sig Dispense Refill  . cyclobenzaprine (FLEXERIL) 10 MG tablet Take 1 tablet (10 mg total) by mouth 3 (three) times daily. 90 tablet 2  . DULoxetine (CYMBALTA) 30 MG capsule Take 1 capsule (30 mg total) by mouth daily. 30 capsule 2  . fenoprofen (NALFON) 600 MG TABS tablet Take 600 mg by mouth 3 (three) times daily.    . OxyCODONE HCl 15 MG TABA Take 1 tablet by mouth 4 (four) times daily.    . pregabalin (LYRICA) 100 MG capsule Take 100 mg by mouth 2 (two) times daily.    Marland Kitchen. tiZANidine (ZANAFLEX) 4 MG capsule Take 4 mg by mouth at bedtime.     No current facility-administered medications for this visit.     Patient confirms/reports the following allergies:  No Known Allergies  No orders of the defined types were placed in this encounter.   AUTHORIZATION INFORMATION Primary Insurance: 1D#: Group #:  Secondary Insurance: 1D#: Group #:  SCHEDULE INFORMATION: Date: 03/16/17 Time: Location:[facility]

## 2017-03-01 NOTE — Progress Notes (Signed)
Subjective:    Patient ID: Lance Carr, male    DOB: Aug 11, 1970, 47 y.o.   MRN: [id number]  Lance Carr is a 47 y.o. male presenting on 03/01/2017 for Establish Care (chronic back pain, need referral to pain management. )   HPI Establish Care New Provider Pt last seen by PCP in [place] about 8 months years ago.  Obtain records. Records release with information was provided to secretary.  Chronic Back Pain Fell 90 feet in 1990s for initial injury with chronic pain and head injury.  Has decreased balance, uses cane, therapy dog, caution to prevent falls.  Has had chronic pain management with [facility] in [place], [place] - Was on Chronic opioids, but has been off for the last 4 weeks.  Would like referral to new pain management specilaist. - Pain is located in thoracic, lumbar spine and has radiculopathy into left testicle (occasionally bilateral) with bilateral sciaticia  Daily Headaches - Migraines Every 2 weeks with headaches.  Using botox injections, was still having headaches.  Last botox injection 2-3 mos prior 01/06/2017.  - Currently headache occurs daily.  Takes 8 ibuprofen daily.  Takes 800 mg twice daily. Had planned to be seen by neurology in [place].   Rheumatology: fibromyalgia treatment in past.  No dx of RA. - Has taken Lyrica for symptoms with partial effect.  More effect than when using gabapentin.  Hx gastric bypass: Oct 2013  Past Medical History:  Diagnosis Date  . Anxiety   . Arthritis   . Chronic back pain   . Colon polyp   . Fibromyalgia   . Frequent headaches   . History of blood clots   . Hypoglycemia   . Sleep apnea   . Thyroid disorder    Past Surgical History:  Procedure Laterality Date  . GASTRIC BYPASS  10/2011  . MENISCUS REPAIR Left   . THYROID SURGERY Left   . VEIN SURGERY     varicose vein revisions   Social History   Socioeconomic History  . Marital status: Married    Spouse name: Lance Carr  . Number of children: Not  on file  . Years of education: Not on file  . Highest education level: Not on file  Social Needs  . Financial resource strain: Patient refused  . Food insecurity - worry: Patient refused  . Food insecurity - inability: Patient refused  . Transportation needs - medical: Patient refused  . Transportation needs - non-medical: Patient refused  Occupational History  . Not on file  Tobacco Use  . Smoking status: Former Smoker    Years: 20.00    Last attempt to quit: 03/01/2005    Years since quitting: 12.0  . Smokeless tobacco: Never Used  Substance and Sexual Activity  . Alcohol use: No    Frequency: Never  . Drug use: No  . Sexual activity: Yes    Comment: partner surgical procedure  Other Topics Concern  . Not on file  Social History Narrative  . Not on file   Family History  Problem Relation Age of Onset  . Heart disease Father   . Diabetes Father   . Varicose Veins Mother   . Osteoarthritis Mother   . Healthy Maternal Grandmother   . Heart disease Maternal Grandfather   . Stroke Maternal Grandfather   . Colon cancer Paternal Uncle    Current Outpatient Medications on File Prior to Visit  Medication Sig  . fenoprofen (NALFON) 600 MG TABS tablet Take 600 mg  by mouth 3 (three) times daily.  . OxyCODONE HCl 15 MG TABA Take 1 tablet by mouth 4 (four) times daily.  Marland Kitchen tiZANidine (ZANAFLEX) 4 MG capsule Take 4 mg by mouth at bedtime.   No current facility-administered medications on file prior to visit.     Review of Systems  Constitutional: Negative.   HENT: Negative.   Eyes: Negative.   Respiratory: Negative.   Cardiovascular: Negative.   Gastrointestinal: Negative.   Endocrine: Negative.   Genitourinary: Positive for testicular pain.  Musculoskeletal: Positive for arthralgias, back pain, myalgias, neck pain and neck stiffness.  Skin: Negative.   Allergic/Immunologic: Negative.   Neurological: Negative.   Hematological: Negative.   Psychiatric/Behavioral:  Negative.    Per HPI unless specifically indicated above     Objective:    BP 122/82 (BP Location: Right Arm, Patient Position: Sitting, Cuff Size: Large)   Pulse 67   Temp 98.4 F (36.9 C) (Oral)   Ht 5' 9.75" (1.772 m)   Wt 289 lb 12.8 oz (131.5 kg)   BMI 41.88 kg/m   Wt Readings from Last 3 Encounters:  03/08/17 290 lb (131.5 kg)  03/01/17 289 lb 12.8 oz (131.5 kg)    Physical Exam  General - morbidly obese, well-appearing, NAD HEENT - Normocephalic, atraumatic Neck - supple, non-tender, no LAD, no thyromegaly, no carotid bruit Heart - RRR, no murmurs heard Lungs - Clear throughout all lobes, no wheezing, crackles, or rhonchi. Normal work of breathing. Musculoskeletal - Low Back Inspection: Normal appearance, Large body habitus, no spinal deformity, symmetrical. Palpation: No tenderness over spinous processes. Bilateral lumbar paraspinal muscles tender and with hypertonicity/spasm. ROM: Full active ROM forward flex / back extension, rotation L/R without discomfort Special Testing: Seated SLR with reproduced localized midline back pain and positive for radicular pain.  Strength: Bilateral hip flex/ext 5/5, knee flex/ext 5/5, ankle dorsiflex/plantarflex 5/5 Neurovascular: intact distal sensation to light touch Extremeties - non-tender, no edema, cap refill < 2 seconds, peripheral pulses intact +2 bilaterally Skin - warm, dry Neuro - awake, alert, oriented x3, antalgic gait - difficult to stand from seated 2/2 pain Psych - Normal mood and affect, normal behavior   No results found for this or any previous visit.    Assessment & Plan:   Problem List Items Addressed This Visit    None    Visit Diagnoses    Chronic bilateral low back pain with bilateral sciatica    -  Primary Pt in chronic pain after traumatic event in past.  Multiple surgeries and treatment modalities in past have been partially effective, but pt remains in significant pain.  Has had chronic opioid  management, but has not had any opioids in last 4 weeks. Currently states he is in severe pain.  Plan: 1. START duloxetine for fibromalgia management. 2. START cyclobenzaprine for muscle relaxant. 3. START lyrica for radiculopathy.  Resume prior dose. 4. Referral pain management clinic.  Can adjust any or all pain medications.   Other Relevant Orders   Ambulatory referral to Pain Clinic   Intractable chronic migraine without aura and with status migrainosus     Currently uncontrolled.  Pt with prior control and approximately 50% reduction in headache days when undergoing botox injection treatment.  Has not used any of the novel injectables for migraine prevention and has not had botox injection for > 3 months.  Consider Amovig or similar medication in future, but could be cost prohibitive.  Plan: 1. Keep headache diary of daily headaches and  any possible triggers to try to identify a pattern. 2. Work to control muscle tension in neck - start cyclobenzaprine. 3. Referral neurology for management.  Multiple treatments tried and failed in past. 4. Followup as needed.   Relevant Medications   Other Relevant Orders   Ambulatory referral to Neurology   Polyp of colon, unspecified part of colon, unspecified type     Pt with prior colon polyps.  Was due to have colonoscopy every 3-5 years and is now overdue.  Pt requests referral for colon cancer screening via colonoscopy.  Family history positive.   Relevant Orders   Ambulatory referral to Gastroenterology      Meds ordered this encounter  Medications  . cyclobenzaprine (FLEXERIL) 10 MG tablet    Sig: Take 1 tablet (10 mg total) by mouth 3 (three) times daily.    Dispense:  90 tablet    Refill:  2    Order Specific Question:   Supervising Provider    Answer:   Smitty CordsKARAMALEGOS, ALEXANDER J [[id number]]  . DULoxetine (CYMBALTA) 30 MG capsule    Sig: Take 1 capsule (30 mg total) by mouth daily.    Dispense:  30 capsule    Refill:  2    Order  Specific Question:   Supervising Provider    Answer:   Smitty CordsKARAMALEGOS, ALEXANDER J [[id number]]    Follow up plan: Return in about 3 months (around 05/29/2017) for headaches, fibromyalgia.  Wilhelmina McardleLauren Koleen Celia, DNP, AGPCNP-BC Adult Gerontology Primary Care Nurse Practitioner [facility] 03/13/2017, 8:42 AM

## 2017-03-08 ENCOUNTER — Ambulatory Visit
Admission: RE | Admit: 2017-03-08 | Discharge: 2017-03-08 | Disposition: A | Discharge status: Home / Self Care | Payer: Medicare Other | Source: Ambulatory Visit | Attending: Nurse Practitioner | Admitting: Nurse Practitioner

## 2017-03-08 ENCOUNTER — Other Ambulatory Visit
Admission: RE | Admit: 2017-03-08 | Discharge: 2017-03-08 | Disposition: A | Discharge status: Home / Self Care | Payer: Medicare Other | Source: Ambulatory Visit | Attending: Nurse Practitioner | Admitting: Nurse Practitioner

## 2017-03-08 ENCOUNTER — Ambulatory Visit: Payer: Medicare Other | Attending: Nurse Practitioner | Admitting: Nurse Practitioner

## 2017-03-08 ENCOUNTER — Encounter: Payer: Self-pay | Admitting: Nurse Practitioner

## 2017-03-08 VITALS — BP 141/95 | HR 73 | Temp 97.8°F | Resp 16 | Ht 69.0 in | Wt 290.0 lb

## 2017-03-08 DIAGNOSIS — Z8349 Family history of other endocrine, nutritional and metabolic diseases: Secondary | ICD-10-CM | POA: Diagnosis not present

## 2017-03-08 DIAGNOSIS — R51 Headache: Secondary | ICD-10-CM | POA: Insufficient documentation

## 2017-03-08 DIAGNOSIS — M4306 Spondylolysis, lumbar region: Secondary | ICD-10-CM | POA: Insufficient documentation

## 2017-03-08 DIAGNOSIS — G8929 Other chronic pain: Secondary | ICD-10-CM | POA: Diagnosis not present

## 2017-03-08 DIAGNOSIS — M179 Osteoarthritis of knee, unspecified: Secondary | ICD-10-CM | POA: Diagnosis not present

## 2017-03-08 DIAGNOSIS — M5137 Other intervertebral disc degeneration, lumbosacral region: Secondary | ICD-10-CM | POA: Insufficient documentation

## 2017-03-08 DIAGNOSIS — M542 Cervicalgia: Secondary | ICD-10-CM | POA: Diagnosis not present

## 2017-03-08 DIAGNOSIS — Z9889 Other specified postprocedural states: Secondary | ICD-10-CM | POA: Insufficient documentation

## 2017-03-08 DIAGNOSIS — M8938 Hypertrophy of bone, other site: Secondary | ICD-10-CM | POA: Insufficient documentation

## 2017-03-08 DIAGNOSIS — E079 Disorder of thyroid, unspecified: Secondary | ICD-10-CM | POA: Diagnosis not present

## 2017-03-08 DIAGNOSIS — M1990 Unspecified osteoarthritis, unspecified site: Secondary | ICD-10-CM | POA: Insufficient documentation

## 2017-03-08 DIAGNOSIS — G894 Chronic pain syndrome: Secondary | ICD-10-CM

## 2017-03-08 DIAGNOSIS — Z8249 Family history of ischemic heart disease and other diseases of the circulatory system: Secondary | ICD-10-CM | POA: Diagnosis not present

## 2017-03-08 DIAGNOSIS — M25552 Pain in left hip: Secondary | ICD-10-CM | POA: Diagnosis not present

## 2017-03-08 DIAGNOSIS — G4730 Sleep apnea, unspecified: Secondary | ICD-10-CM | POA: Diagnosis not present

## 2017-03-08 DIAGNOSIS — M25562 Pain in left knee: Secondary | ICD-10-CM

## 2017-03-08 DIAGNOSIS — M5442 Lumbago with sciatica, left side: Secondary | ICD-10-CM | POA: Diagnosis not present

## 2017-03-08 DIAGNOSIS — M25551 Pain in right hip: Secondary | ICD-10-CM

## 2017-03-08 DIAGNOSIS — Z789 Other specified health status: Secondary | ICD-10-CM

## 2017-03-08 DIAGNOSIS — M47812 Spondylosis without myelopathy or radiculopathy, cervical region: Secondary | ICD-10-CM | POA: Diagnosis not present

## 2017-03-08 DIAGNOSIS — R1032 Left lower quadrant pain: Secondary | ICD-10-CM | POA: Diagnosis not present

## 2017-03-08 DIAGNOSIS — G4486 Cervicogenic headache: Secondary | ICD-10-CM

## 2017-03-08 DIAGNOSIS — Z86718 Personal history of other venous thrombosis and embolism: Secondary | ICD-10-CM | POA: Diagnosis not present

## 2017-03-08 DIAGNOSIS — M533 Sacrococcygeal disorders, not elsewhere classified: Secondary | ICD-10-CM

## 2017-03-08 DIAGNOSIS — M797 Fibromyalgia: Secondary | ICD-10-CM | POA: Insufficient documentation

## 2017-03-08 DIAGNOSIS — Z833 Family history of diabetes mellitus: Secondary | ICD-10-CM | POA: Diagnosis not present

## 2017-03-08 DIAGNOSIS — Z87891 Personal history of nicotine dependence: Secondary | ICD-10-CM | POA: Diagnosis not present

## 2017-03-08 DIAGNOSIS — Z79899 Other long term (current) drug therapy: Secondary | ICD-10-CM | POA: Diagnosis not present

## 2017-03-08 DIAGNOSIS — M79605 Pain in left leg: Secondary | ICD-10-CM

## 2017-03-08 DIAGNOSIS — Z9884 Bariatric surgery status: Secondary | ICD-10-CM | POA: Insufficient documentation

## 2017-03-08 DIAGNOSIS — M4317 Spondylolisthesis, lumbosacral region: Secondary | ICD-10-CM | POA: Insufficient documentation

## 2017-03-08 DIAGNOSIS — Z79891 Long term (current) use of opiate analgesic: Secondary | ICD-10-CM

## 2017-03-08 DIAGNOSIS — M47816 Spondylosis without myelopathy or radiculopathy, lumbar region: Secondary | ICD-10-CM | POA: Diagnosis not present

## 2017-03-08 DIAGNOSIS — M1712 Unilateral primary osteoarthritis, left knee: Secondary | ICD-10-CM | POA: Insufficient documentation

## 2017-03-08 DIAGNOSIS — M899 Disorder of bone, unspecified: Secondary | ICD-10-CM

## 2017-03-08 DIAGNOSIS — M4802 Spinal stenosis, cervical region: Secondary | ICD-10-CM | POA: Diagnosis not present

## 2017-03-08 DIAGNOSIS — G629 Polyneuropathy, unspecified: Secondary | ICD-10-CM | POA: Insufficient documentation

## 2017-03-08 DIAGNOSIS — Z823 Family history of stroke: Secondary | ICD-10-CM | POA: Diagnosis not present

## 2017-03-08 DIAGNOSIS — Z86010 Personal history of colonic polyps: Secondary | ICD-10-CM | POA: Diagnosis not present

## 2017-03-08 DIAGNOSIS — Z800 Family history of malignant neoplasm of digestive organs: Secondary | ICD-10-CM | POA: Diagnosis not present

## 2017-03-08 LAB — COMPREHENSIVE METABOLIC PANEL
ALT: 15 U/L — ABNORMAL LOW (ref 17–63)
AST: 20 U/L (ref 15–41)
Albumin: 4.3 g/dL (ref 3.5–5.0)
Alkaline Phosphatase: 52 U/L (ref 38–126)
Anion gap: 7 (ref 5–15)
BUN: 12 mg/dL (ref 6–20)
CHLORIDE: 104 mmol/L (ref 101–111)
CO2: 26 mmol/L (ref 22–32)
CREATININE: 0.8 mg/dL (ref 0.61–1.24)
Calcium: 8.9 mg/dL (ref 8.9–10.3)
Glucose, Bld: 98 mg/dL (ref 65–99)
POTASSIUM: 3.8 mmol/L (ref 3.5–5.1)
Sodium: 137 mmol/L (ref 135–145)
TOTAL PROTEIN: 7.7 g/dL (ref 6.5–8.1)
Total Bilirubin: 0.9 mg/dL (ref 0.3–1.2)

## 2017-03-08 LAB — MAGNESIUM: MAGNESIUM: 2.1 mg/dL (ref 1.7–2.4)

## 2017-03-08 LAB — VITAMIN B12: VITAMIN B 12: 246 pg/mL (ref 180–914)

## 2017-03-08 LAB — C-REACTIVE PROTEIN

## 2017-03-08 LAB — SEDIMENTATION RATE: SED RATE: 8 mm/h (ref 0–15)

## 2017-03-08 NOTE — Progress Notes (Signed)
Patient's Name: Lance Carr  MRN: [id number]  Referring Provider: Mikey College, *  DOB: May 23, 1970  PCP: Mikey College, NP  DOS: 03/08/2017  Note by: Dionisio David NP  Service setting: Ambulatory outpatient  Specialty: Interventional Pain Management  Location: [facility] (AMB) Pain Management Facility    Patient type: New Patient    Primary Reason(s) for Visit: Initial Patient Evaluation CC: Back Pain (bilatera); Joint Pain (mainly on the left); Groin Pain (left ); Neck Pain (middle); and Headache (received botox therapy)  HPI  [name] is a 47 y.o. year old, male patient, who comes today for an initial evaluation. He does not have a problem list on file.. His primarily concern today is the Back Pain (bilatera); Joint Pain (mainly on the left); Groin Pain (left ); Neck Pain (middle); and Headache (received botox therapy)  Pain Assessment: Location: Left, Right, Lower Back(see visit info) Radiating: back pain shoots down into the legs to approx the back of the knee  Onset: More than a month ago Duration: Chronic pain Quality: Constant, Discomfort, Sharp, Shooting Severity: 8 /10 (self-reported pain score)  Note: Reported level is compatible with observation. Clinically the patient looks like a 3/10 A 3/10 is viewed as "Moderate" and described as significantly interfering with activities of daily living (ADL). It becomes difficult to feed, bathe, get dressed, get on and off the toilet or to perform personal hygiene functions. Difficult to get in and out of bed or a chair without assistance. Very distracting. With effort, it can be ignored when deeply involved in activities. Information on the proper use of the pain scale provided to the patient today. When using our objective Pain Scale, levels between 6 and 10/10 are said to belong in an emergency room, as it progressively worsens from a 6/10, described as severely limiting, requiring emergency care not usually available at an  outpatient pain management facility. At a 6/10 level, communication becomes difficult and requires great effort. Assistance to reach the emergency department may be required. Facial flushing and profuse sweating along with potentially dangerous increases in heart rate and blood pressure will be evident. Effect on ADL: patient is disabled.  pain complicates activities on a day to day Timing: Constant Modifying factors: botox therapy helped the headaches for short term.  medications, injections  Onset and Duration: Started with work-related injury and Date of onset: 1990 Cause of pain: Work related accident or event Severity: NAS-11 at its worse: 9/10, NAS-11 at its best: 4/10, NAS-11 now: 9/10 and NAS-11 on the average: 6/10 Timing: Not influenced by the time of the day and During activity or exercise Aggravating Factors: Bending, Intercourse (sex), Kneeling, Lifiting, Motion, Prolonged sitting, Prolonged standing, Squatting and Stooping  Alleviating Factors: Bending, Medications and Nerve blocks Associated Problems: Night-time cramps, Dizziness, Fatigue, Inability to concentrate, Numbness, Spasms, Swelling, Tingling, Weakness, Pain that wakes patient up and Pain that does not allow patient to sleep Quality of Pain: Aching, Burning, Intermittent, Cramping, Distressing, Exhausting, Feeling of constriction, Getting shorter, Heavy, Punishing, Sharp, Shooting, Stabbing, Tender, Throbbing and Tingling Previous Examinations or Tests: Bone scan, CT scan, EMG/PNCV, Endoscopy, MRI scan, Nerve block, X-rays, Neurological evaluation, Orthopedic evaluation, Chiropractic evaluation and Psychiatric evaluation Previous Treatments: Chiropractic manipulations, Epidural steroid injections, Narcotic medications, Physical Therapy, Pool exercises, Steroid treatments by mouth, Stretching exercises and Trigger point injections  The patient comes into the clinics today for the first time for a chronic pain management  evaluation. According to the patient his primary area of pain is in  his lower back. He suffered a 40 foot fall in 1991 while working in [place].  He denies any weakness. He denies any previous surgery. He has had epidural steroids and nerve blocks which were both affected. 6 of 7 days last time in 2018 in [place]. He admits that he has had physical therapy on various occasions along with chiropractic therapy and aqua therapy and none were effective. He denies any recent images.  His second area of pain is in his left side groin and leg. The pain goes into his left testicle and down the back of the leg to the knee. He admits that he has occasional numbness and tingling in his feet.  His third area of pain is in his neck. He states is midline. He describes it as a burning feeling. He denies any numbness tingling or weakness at upper extremities. He denies any surgeries, interventional therapy or recent images.  His fourth area of pain is headaches. He admits that they are in front and back of the head. He does receive Botox injections with this which is effective.  His next area of pain is in his left knee. He admits that he does have some swelling and weakness. He did have surgery after the accident for possible meniscus. He admits that he had previous physical therapy but denies any recent images.  He admits that he is currently having new hip pain started approximately 2-3 months ago. She admits that this is bilateral he is unable to lay on his sides. He does feel like this is getting worse. He denies any previous surgery, interventional therapy, physical therapy or images.  Today I took the time to provide the patient with information regarding this pain practice. The patient was informed that the practice is divided into two sections: an interventional pain management section, as well as a completely separate and distinct medication management section. I explained that there are procedure  days for interventional therapies, and evaluation days for follow-ups and medication management. Because of the amount of documentation required during both, they are kept separated. This means that there is the possibility that he may be scheduled for a procedure on one day, and medication management the next. I have also informed him that because of staffing and facility limitations, this practice will no longer take patients for medication management only. To illustrate the reasons for this, I gave the patient the example of surgeons, and how inappropriate it would be to refer a patient to his/her care, just to write for the post-surgical antibiotics on a surgery done by a different surgeon.   Because interventional pain management is part of the board-certified specialty for the doctors, the patient was informed that joining this practice means that they are open to any and all interventional therapies. I made it clear that this does not mean that they will be forced to have any procedures done. What this means is that I believe interventional therapies to be essential part of the diagnosis and proper management of chronic pain conditions. Therefore, patients not interested in these interventional alternatives will be better served under the care of a different practitioner.  The patient was also made aware of my Comprehensive Pain Management Safety Guidelines where by joining this practice, they limit all of their nerve blocks and joint injections to those done by our practice, for as long as we are retained to manage their care. Historic Controlled Substance Pharmacotherapy Review  PMP and historical list of controlled substances: Oxycodone 15  mg, Lyrica 150 mg, diazepam 5 mg, oxycodone 20 mg, zolpidem 5 mg, (last 2 years for [place]) Highest opioid analgesic regimen found: Oxycodone 20 mg 4 times a day (last fill date 12/25/2015) oxycodone 80 mg per day Most recent opioid analgesic: Oxycodone 15  mg 4 times a day (last fill date 09/29/2016) oxycodone 60 mg Current opioid analgesics: Oxycodone 15 mg 4 times a day (last fill date 09/29/2016) oxycodone 60 mg Highest recorded MME/day: 120 mg/day MME/day: 90 mg/day Medications: The patient did not bring the medication(s) to the appointment, as requested in our "New Patient Package" Pharmacodynamics: Desired effects: Analgesia: The patient reports >50% benefit. Reported improvement in function: The patient reports medication allows him to accomplish basic ADLs. Clinically meaningful improvement in function (CMIF): Sustained CMIF goals met Perceived effectiveness: Described as relatively effective, allowing for increase in activities of daily living (ADL) Undesirable effects: Side-effects or Adverse reactions: None reported Historical Monitoring: The patient  reports that he does not use drugs. List of all UDS Test(s): No results found for: MDMA, COCAINSCRNUR, PCPSCRNUR, PCPQUANT, CANNABQUANT, THCU, [place] List of all Serum Drug Screening Test(s):  No results found for: AMPHSCRSER, BARBSCRSER, BENZOSCRSER, COCAINSCRSER, PCPSCRSER, PCPQUANT, THCSCRSER, CANNABQUANT, OPIATESCRSER, OXYSCRSER, PROPOXSCRSER Historical Background Evaluation: [place] PDMP: Six (6) year initial data search conducted.             [place] Department of public safety, offender search: Editor, commissioning Information) Non-contributory Risk Assessment Profile: Aberrant behavior: None observed or detected today Risk factors for fatal opioid overdose: age 21-20 years old, Benzodiazepine use and male gender Fatal overdose hazard ratio (HR): Calculation deferred Non-fatal overdose hazard ratio (HR): Calculation deferred Risk of opioid abuse or dependence: 0.7-3.0% with doses ? 36 MME/day and 6.1-26% with doses ? 120 MME/day. Substance use disorder (SUD) risk level: Pending results of Medical Psychology Evaluation for SUD Opioid risk tool (ORT) (Total Score): 0  ORT Scoring interpretation  table:  Score <3 = Low Risk for SUD  Score between 4-7 = Moderate Risk for SUD  Score >8 = High Risk for Opioid Abuse   PHQ-2 Depression Scale:  Total score: 0  PHQ-2 Scoring interpretation table: (Score and probability of major depressive disorder)  Score 0 = No depression  Score 1 = 15.4% Probability  Score 2 = 21.1% Probability  Score 3 = 38.4% Probability  Score 4 = 45.5% Probability  Score 5 = 56.4% Probability  Score 6 = 78.6% Probability   PHQ-9 Depression Scale:  Total score: 0  PHQ-9 Scoring interpretation table:  Score 0-4 = No depression  Score 5-9 = Mild depression  Score 10-14 = Moderate depression  Score 15-19 = Moderately severe depression  Score 20-27 = Severe depression (2.4 times higher risk of SUD and 2.89 times higher risk of overuse)   Pharmacologic Plan: Pending ordered tests and/or consults  Meds  The patient has a current medication list which includes the following prescription(s): fenoprofen, oxycodone hcl, and tizanidine.  Current Outpatient Medications on File Prior to Visit  Medication Sig  . fenoprofen (NALFON) 600 MG TABS tablet Take 600 mg by mouth 3 (three) times daily.  . OxyCODONE HCl 15 MG TABA Take 1 tablet by mouth 4 (four) times daily.  Marland Kitchen tiZANidine (ZANAFLEX) 4 MG capsule Take 4 mg by mouth at bedtime.   No current facility-administered medications on file prior to visit.    Imaging Review    Note: No new results found.        ROS  Cardiovascular History: No reported cardiovascular  signs or symptoms such as High blood pressure, coronary artery disease, abnormal heart rate or rhythm, heart attack, blood thinner therapy or heart weakness and/or failure Pulmonary or Respiratory History: No reported pulmonary signs or symptoms such as wheezing and difficulty taking a deep full breath (Asthma), difficulty blowing air out (Emphysema), coughing up mucus (Bronchitis), persistent dry cough, or temporary stoppage of breathing during  sleep Neurological History: Abnormal skin sensations (Peripheral Neuropathy) Review of Past Neurological Studies: No results found for this or any previous visit. Psychological-Psychiatric History: No reported psychological or psychiatric signs or symptoms such as difficulty sleeping, anxiety, depression, delusions or hallucinations (schizophrenial), mood swings (bipolar disorders) or suicidal ideations or attempts Gastrointestinal History: No reported gastrointestinal signs or symptoms such as vomiting or evacuating blood, reflux, heartburn, alternating episodes of diarrhea and constipation, inflamed or scarred liver, or pancreas or irrregular and/or infrequent bowel movements Genitourinary History: No reported renal or genitourinary signs or symptoms such as difficulty voiding or producing urine, peeing blood, non-functioning kidney, kidney stones, difficulty emptying the bladder, difficulty controlling the flow of urine, or chronic kidney disease and Difficulty emptying the bladder or controlling the flow of urine (Neurogenic bladder) Hematological History: Brusing easily Endocrine History: No reported endocrine signs or symptoms such as high or low blood sugar, rapid heart rate due to high thyroid levels, obesity or weight gain due to slow thyroid or thyroid disease Rheumatologic History: Generalized muscle aches (Fibromyalgia) Musculoskeletal History: Negative for myasthenia gravis, muscular dystrophy, multiple sclerosis or malignant hyperthermia Work History: Disabled  Allergies  Mr. Liaw has No Known Allergies.  Laboratory Chemistry  Inflammation Markers No results found for: CRP, ESRSEDRATE (CRP: Acute Phase) (ESR: Chronic Phase) Renal Function Markers No results found for: BUN, CREATININE, GFRAA, GFRNONAA Hepatic Function Markers No results found for: AST, ALT, ALBUMIN, ALKPHOS, HCVAB Electrolytes No results found for: NA, K, CL, CALCIUM, MG Neuropathy Markers No results found  for: [id number] Bone Pathology Markers No results found for: Hendricks Milo, VD125OH2TOT, [id number], [id number], 25OHVITD1, 25OHVITD2, 25OHVITD3, CALCIUM, TESTOFREE, TESTOSTERONE Coagulation Parameters No results found for: INR, LABPROT, APTT, PLT Cardiovascular Markers No results found for: BNP, HGB, HCT Note: Lab results reviewed.  [place]  Drug: Mr. Tramell  reports that he does not use drugs. Alcohol:  reports that he does not drink alcohol. Tobacco:  reports that he quit smoking about 12 years ago. He quit after 20.00 years of use. he has never used smokeless tobacco. Medical:  has a past medical history of Anxiety, Arthritis, Chronic back pain, Colon polyp, Fibromyalgia, Frequent headaches, History of blood clots, Hypoglycemia, Sleep apnea, and Thyroid disorder. Family: family history includes Colon cancer in his paternal uncle; Diabetes in his father; Healthy in his maternal grandmother; Heart disease in his father and maternal grandfather; Osteoarthritis in his mother; Stroke in his maternal grandfather; Varicose Veins in his mother.  Past Surgical History:  Procedure Laterality Date  . GASTRIC BYPASS  10/2011  . MENISCUS REPAIR Left   . THYROID SURGERY Left   . VEIN SURGERY     varicose vein revisions   Active Ambulatory Problems    Diagnosis Date Noted  . No Active Ambulatory Problems   Resolved Ambulatory Problems    Diagnosis Date Noted  . No Resolved Ambulatory Problems   Past Medical History:  Diagnosis Date  . Anxiety   . Arthritis   . Chronic back pain   . Colon polyp   . Fibromyalgia   . Frequent headaches   . History of blood clots   .  Hypoglycemia   . Sleep apnea   . Thyroid disorder    Constitutional Exam  General appearance: Well nourished, well developed, and well hydrated. In no apparent acute distress Vitals:   03/08/17 0807  BP: (!) 141/95  Pulse: 73  Resp: 16  Temp: 97.8 F (36.6 C)  TempSrc: Oral  SpO2: 98%  Weight: 290 lb (131.5 kg)   Height: _0  (1.753 m)   BMI Assessment: Estimated body mass index is 42.83 kg/m as calculated from the following:   Height as of this encounter: _1  (1.753 m).   Weight as of this encounter: 290 lb (131.5 kg).  BMI interpretation table: BMI level Category Range association with higher incidence of chronic pain  <18 kg/m2 Underweight   18.5-24.9 kg/m2 Ideal body weight   25-29.9 kg/m2 Overweight Increased incidence by 20%  30-34.9 kg/m2 Obese (Class I) Increased incidence by 68%  35-39.9 kg/m2 Severe obesity (Class II) Increased incidence by 136%  >40 kg/m2 Extreme obesity (Class III) Increased incidence by 254%   BMI Readings from Last 4 Encounters:  03/08/17 42.83 kg/m  03/01/17 41.88 kg/m   Wt Readings from Last 4 Encounters:  03/08/17 290 lb (131.5 kg)  03/01/17 289 lb 12.8 oz (131.5 kg)  Psych/Mental status: Alert, oriented x 3 (person, place, & time)       Eyes: PERLA Respiratory: No evidence of acute respiratory distress  Cervical Spine Exam  Inspection: No masses, redness, or swelling Alignment: Symmetrical Functional ROM: Adequate ROM      Stability: No instability detected Muscle strength & Tone: Functionally intact Sensory: Unimpaired Palpation: No palpable anomalies              Upper Extremity (UE) Exam    Side: Right upper extremity  Side: Left upper extremity  Inspection: No masses, redness, swelling, or asymmetry. No contractures  Inspection: No masses, redness, swelling, or asymmetry. No contractures  Functional ROM: Unrestricted ROM          Functional ROM: Unrestricted ROM          Muscle strength & Tone: Functionally intact  Muscle strength & Tone: Functionally intact  Sensory: Unimpaired  Sensory: Unimpaired  Palpation: No palpable anomalies              Palpation: No palpable anomalies              Specialized Test(s): Deferred         Specialized Test(s): Deferred          Thoracic Spine Exam  Inspection: No masses, redness, or  swelling Alignment: Symmetrical Functional ROM: Unrestricted ROM Stability: No instability detected Sensory: Unimpaired Muscle strength & Tone: No palpable anomalies  Lumbar Spine Exam  Inspection: No masses, redness, or swelling Alignment: Symmetrical Functional ROM: Unrestricted ROM      Stability: No instability detected Muscle strength & Tone: Increased muscle tone over affected area Sensory: Unimpaired Palpation: Complains of area being tender to palpation       Provocative Tests: Lumbar Hyperextension and rotation test: Unable to perform due to pain. Patrick's Maneuver: Positive for left-sided S-I arthralgia              Gait & Posture Assessment  Ambulation: Patient ambulates using a cane Gait: Antalgic Posture: Tense   Lower Extremity Exam    Side: Right lower extremity  Side: Left lower extremity  Inspection: No masses, redness, swelling, or asymmetry. No contractures  Inspection: No masses, redness, swelling, or asymmetry. No contractures  Functional  ROM: Unrestricted ROM          Functional ROM: Unrestricted ROM          Muscle strength & Tone: Functionally intact  Muscle strength & Tone: Functionally intact  Sensory: Unimpaired  Sensory: Unimpaired  Palpation: No palpable anomalies  Palpation: Complains of area being tender to palpation   Assessment  Primary Diagnosis & Pertinent Problem List: There were no encounter diagnoses.  Visit Diagnosis: No diagnosis found. Plan of Care  Initial treatment plan:  Please be advised that as per protocol, today's visit has been an evaluation only. We have not taken over the patient's controlled substance management.  Problem-specific plan: No problem-specific Assessment & Plan notes found for this encounter.  Ordered Lab-work, Procedure(s), Referral(s), & Consult(s): No orders of the defined types were placed in this encounter.  Pharmacotherapy: Medications ordered:  No orders of the defined types were placed in this  encounter.  Medications administered during this visit: Doristine Johns had no medications administered during this visit.   Pharmacotherapy under consideration:  Opioid Analgesics: The patient was informed that there is no guarantee that he would be a candidate for opioid analgesics. The decision will be made following CDC guidelines. This decision will be based on the results of diagnostic studies, as well as Mr. Symanski's risk profile.  Membrane stabilizer: To be determined at a later time Muscle relaxant: To be determined at a later time NSAID: To be determined at a later time Other analgesic(s): To be determined at a later time   Interventional therapies under consideration: Mr. Ells was informed that there is no guarantee that he would be a candidate for interventional therapies. The decision will be based on the results of diagnostic studies, as well as Mr. Schnepf's risk profile.  Possible procedure(s): Diagnostic left-sided LESI Diagnostic bilateral lumbar facet Possible bilateral lumbar facet RFA Diagnostic cervical facet nerve block Possible  cervical facet RFA Diagnostic occipital nerve block Diagnostic Botox injection Diagnostic left knee intra-articular steroid injection Diagnostic left knee genicular nerve Possible left knee genicular RFA Hyalgan series Diagnostic bilateral intra-articular hip injections    Provider-requested follow-up: Return for 2nd Visit, w/ Dr. Dossie Arbour, after [facility] eval.  Future Appointments  Date Time Provider [place]  05/29/2017  9:00 AM Mikey College, NP [facility] None    Primary Care Physician: Mikey College, NP Location: [facility] Note by:  Date: 03/08/2017; Time: 8:24 AM  Pain Score Disclaimer: We use the NRS-11 scale. This is a self-reported, subjective measurement of pain severity with only modest accuracy. It is used primarily to identify changes within a particular patient.  It must be understood that outpatient pain scales are significantly less accurate that those used for research, where they can be applied under ideal controlled circumstances with minimal exposure to variables. In reality, the score is likely to be a combination of pain intensity and pain affect, where pain affect describes the degree of emotional arousal or changes in action readiness caused by the sensory experience of pain. Factors such as social and work situation, setting, emotional state, anxiety levels, expectation, and prior pain experience may influence pain perception and show large inter-individual differences that may also be affected by time variables.  Patient instructions provided during this appointment: Patient Instructions   ___ ____________________________________________________________________________________________  Appointment Policy Summary  It is our goal and responsibility to provide the medical community with assistance in the evaluation and management of patients with chronic pain. Unfortunately our resources are limited.  Because we do not have an unlimited amount of time, or available appointments, we are required to closely monitor and manage their use. The following rules exist to maximize their use:  Patient's responsibilities: 1. Punctuality:  At what time should I arrive? You should be physically present in our office 30 minutes before your scheduled appointment. Your scheduled appointment is with your assigned healthcare provider. However, it takes 5-10 minutes to be "checked-in", and another 15 minutes for the nurses to do the admission. If you arrive to our office at the time you were given for your appointment, you will end up being at least 20-25 minutes late to your appointment with the provider. 2. Tardiness:  What happens if I arrive only a few minutes after my scheduled appointment time? You will need to reschedule your appointment. The cutoff is your  appointment time. This is why it is so important that you arrive at least 30 minutes before that appointment. If you have an appointment scheduled for 10:00 AM and you arrive at 10:01, you will be required to reschedule your appointment.  3. Plan ahead:  Always assume that you will encounter traffic on your way in. Plan for it. If you are dependent on a driver, make sure they understand these rules and the need to arrive early. 4. Other appointments and responsibilities:  Avoid scheduling any other appointments before or after your pain clinic appointments.  5. Be prepared:  Write down everything that you need to discuss with your healthcare provider and give this information to the admitting nurse. Write down the medications that you will need refilled. Bring your pills and bottles (even the empty ones), to all of your appointments, except for those where a procedure is scheduled. 6. No children or pets:  Find someone to take care of them. It is not appropriate to bring them in. 7. Scheduling changes:  We request "advanced notification" of any changes or cancellations. 8. Advanced notification:  Defined as a time period of more than 24 hours prior to the originally scheduled appointment. This allows for the appointment to be offered to other patients. 9. Rescheduling:  When a visit is rescheduled, it will require the cancellation of the original appointment. For this reason they both fall within the category of "Cancellations".  10. Cancellations:  They require advanced notification. Any cancellation less than 24 hours before the  appointment will be recorded as a "No Show". 11. No Show:  Defined as an unkept appointment where the patient failed to notify or declare to the practice their intention or inability to keep the appointment.  Corrective process for repeat offenders:  1. Tardiness: Three (3) episodes of rescheduling due to late arrivals will be recorded as one (1) "No  Show". 2. Cancellation or reschedule: Three (3) cancellations or rescheduling will be recorded as one (1) "No Show". 3. "No Shows": Three (3) "No Shows" within a 12 month period will result in discharge from the practice.  ____________________________________________________________________________________________   _________________________________________________________________________________________  Pain Scale  Introduction: The pain score used by this practice is the Verbal Numerical Rating Scale (VNRS-11). This is an 11-point scale. It is for adults and children 10 years or older. There are significant differences in how the pain score is reported, used, and applied. Forget everything you learned in the past and learn this scoring system.  General Information: The scale should reflect your current level of pain. Unless you are specifically asked for the level of your worst pain, or your average pain.  If you are asked for one of these two, then it should be understood that it is over the past 24 hours.  Basic Activities of Daily Living (ADL): Personal hygiene, dressing, eating, transferring, and using restroom.  Instructions: Most patients tend to report their level of pain as a combination of two factors, their physical pain and their psychosocial pain. This last one is also known as "suffering" and it is reflection of how physical pain affects you socially and psychologically. From now on, report them separately. From this point on, when asked to report your pain level, report only your physical pain. Use the following table for reference.  Pain Clinic Pain Levels (0-5/10)  Pain Level Score  Description  No Pain 0   Mild pain 1 Nagging, annoying, but does not interfere with basic activities of daily living (ADL). Patients are able to eat, bathe, get dressed, toileting (being able to get on and off the toilet and perform personal hygiene functions), transfer (move in and out of bed or a  chair without assistance), and maintain continence (able to control bladder and bowel functions). Blood pressure and heart rate are unaffected. A normal heart rate for a healthy adult ranges from 60 to 100 bpm (beats per minute).   Mild to moderate pain 2 Noticeable and distracting. Impossible to hide from other people. More frequent flare-ups. Still possible to adapt and function close to normal. It can be very annoying and may have occasional stronger flare-ups. With discipline, patients may get used to it and adapt.   Moderate pain 3 Interferes significantly with activities of daily living (ADL). It becomes difficult to feed, bathe, get dressed, get on and off the toilet or to perform personal hygiene functions. Difficult to get in and out of bed or a chair without assistance. Very distracting. With effort, it can be ignored when deeply involved in activities.   Moderately severe pain 4 Impossible to ignore for more than a few minutes. With effort, patients may still be able to manage work or participate in some social activities. Very difficult to concentrate. Signs of autonomic nervous system discharge are evident: dilated pupils (mydriasis); mild sweating (diaphoresis); sleep interference. Heart rate becomes elevated (>115 bpm). Diastolic blood pressure (lower number) rises above 100 mmHg. Patients find relief in laying down and not moving.   Severe pain 5 Intense and extremely unpleasant. Associated with frowning face and frequent crying. Pain overwhelms the senses.  Ability to do any activity or maintain social relationships becomes significantly limited. Conversation becomes difficult. Pacing back and forth is common, as getting into a comfortable position is nearly impossible. Pain wakes you up from deep sleep. Physical signs will be obvious: pupillary dilation; increased sweating; goosebumps; brisk reflexes; cold, clammy hands and feet; nausea, vomiting or dry heaves; loss of appetite;  significant sleep disturbance with inability to fall asleep or to remain asleep. When persistent, significant weight loss is observed due to the complete loss of appetite and sleep deprivation.  Blood pressure and heart rate becomes significantly elevated. Caution: If elevated blood pressure triggers a pounding headache associated with blurred vision, then the patient should immediately seek attention at an urgent or emergency care unit, as these may be signs of an impending stroke.    Emergency Department Pain Levels (6-10/10)  Emergency Room Pain 6 Severely limiting. Requires emergency care and should not be seen or managed at an outpatient pain management facility. Communication becomes difficult and requires great effort. Assistance to reach the emergency department may  be required. Facial flushing and profuse sweating along with potentially dangerous increases in heart rate and blood pressure will be evident.   Distressing pain 7 Self-care is very difficult. Assistance is required to transport, or use restroom. Assistance to reach the emergency department will be required. Tasks requiring coordination, such as bathing and getting dressed become very difficult.   Disabling pain 8 Self-care is no longer possible. At this level, pain is disabling. The individual is unable to do even the most "basic" activities such as walking, eating, bathing, dressing, transferring to a bed, or toileting. Fine motor skills are lost. It is difficult to think clearly.   Incapacitating pain 9 Pain becomes incapacitating. Thought processing is no longer possible. Difficult to remember your own name. Control of movement and coordination are lost.   The worst pain imaginable 10 At this level, most patients pass out from pain. When this level is reached, collapse of the autonomic nervous system occurs, leading to a sudden drop in blood pressure and heart rate. This in turn results in a temporary and dramatic drop in blood  flow to the brain, leading to a loss of consciousness. Fainting is one of the body's self defense mechanisms. Passing out puts the brain in a calmed state and causes it to shut down for a while, in order to begin the healing process.    Summary: 1. Refer to this scale when providing [place] with your pain level. 2. Be accurate and careful when reporting your pain level. This will help with your care. 3. Over-reporting your pain level will lead to loss of credibility. 4. Even a level of 1/10 means that there is pain and will be treated at our facility. 5. High, inaccurate reporting will be documented as "Symptom Exaggeration", leading to loss of credibility and suspicions of possible secondary gains such as obtaining more narcotics, or wanting to appear disabled, for fraudulent reasons. 6. Only pain levels of 5 or below will be seen at our facility. 7. Pain levels of 6 and above will be sent to the Emergency Department and the appointment cancelled. ____________________________________________________________________________________________

## 2017-03-08 NOTE — Progress Notes (Signed)
Results were reviewed and found to be: mildly abnormal  No acute injury or pathology identified  Review would suggest interventional pain management techniques may be of benefit 

## 2017-03-08 NOTE — Progress Notes (Signed)
Results were reviewed and found to be: abnormal  No acute injury or pathology identified  Review would suggest interventional pain management techniques may be of benefit

## 2017-03-08 NOTE — Patient Instructions (Signed)

## 2017-03-08 NOTE — Progress Notes (Signed)
Safety precautions to be maintained throughout the outpatient stay will include: orient to surroundings, keep bed in low position, maintain call bell within reach at all times, provide assistance with transfer out of bed and ambulation.  

## 2017-03-09 ENCOUNTER — Telehealth: Payer: Self-pay | Admitting: Gastroenterology

## 2017-03-09 LAB — VITAMIN D 25 HYDROXY (VIT D DEFICIENCY, FRACTURES): Vit D, 25-Hydroxy: 22.4 ng/mL — ABNORMAL LOW (ref 30.0–100.0)

## 2017-03-09 NOTE — Telephone Encounter (Signed)
Patient needs to cancel his procedure with Dr Tobi BastosAnna on 03-16-17 because of his new insurance.He did not want to reschedule at this time

## 2017-03-10 NOTE — Telephone Encounter (Signed)
Patients colonoscopy has been canceled as requested per telephone message.  Notified Trish to cancel procedure 03/16/17.

## 2017-03-12 LAB — 25-HYDROXYVITAMIN D LCMS D2+D3
25-HYDROXY, VITAMIN D-2: 9.4 ng/mL
25-HYDROXY, VITAMIN D-3: 16 ng/mL

## 2017-03-12 LAB — 25-HYDROXY VITAMIN D LCMS D2+D3: 25-Hydroxy, Vitamin D: 25 ng/mL — ABNORMAL LOW

## 2017-03-13 ENCOUNTER — Encounter: Payer: Self-pay | Admitting: Nurse Practitioner

## 2017-03-14 LAB — COMPLIANCE DRUG ANALYSIS, UR

## 2017-03-16 ENCOUNTER — Ambulatory Visit: Admission: RE | Admit: 2017-03-16 | Payer: Medicare Other | Source: Ambulatory Visit | Admitting: Gastroenterology

## 2017-03-16 ENCOUNTER — Encounter: Admission: RE | Payer: Self-pay | Source: Ambulatory Visit

## 2017-03-16 SURGERY — COLONOSCOPY WITH PROPOFOL
Anesthesia: General

## 2017-04-26 ENCOUNTER — Ambulatory Visit: Payer: Medicare Other | Attending: Pain Medicine | Admitting: Pain Medicine

## 2017-04-26 ENCOUNTER — Other Ambulatory Visit: Payer: Self-pay

## 2017-04-26 ENCOUNTER — Encounter: Payer: Self-pay | Admitting: Pain Medicine

## 2017-04-26 VITALS — BP 131/96 | HR 69 | Temp 97.7°F | Ht 69.0 in | Wt 270.0 lb

## 2017-04-26 DIAGNOSIS — G4486 Cervicogenic headache: Secondary | ICD-10-CM

## 2017-04-26 DIAGNOSIS — M5442 Lumbago with sciatica, left side: Secondary | ICD-10-CM | POA: Diagnosis not present

## 2017-04-26 DIAGNOSIS — M25562 Pain in left knee: Secondary | ICD-10-CM

## 2017-04-26 DIAGNOSIS — R1032 Left lower quadrant pain: Secondary | ICD-10-CM | POA: Diagnosis not present

## 2017-04-26 DIAGNOSIS — M47812 Spondylosis without myelopathy or radiculopathy, cervical region: Secondary | ICD-10-CM | POA: Insufficient documentation

## 2017-04-26 DIAGNOSIS — M4306 Spondylolysis, lumbar region: Secondary | ICD-10-CM

## 2017-04-26 DIAGNOSIS — M5137 Other intervertebral disc degeneration, lumbosacral region: Secondary | ICD-10-CM | POA: Diagnosis not present

## 2017-04-26 DIAGNOSIS — M5388 Other specified dorsopathies, sacral and sacrococcygeal region: Secondary | ICD-10-CM

## 2017-04-26 DIAGNOSIS — M25552 Pain in left hip: Secondary | ICD-10-CM

## 2017-04-26 DIAGNOSIS — M7918 Myalgia, other site: Secondary | ICD-10-CM | POA: Diagnosis not present

## 2017-04-26 DIAGNOSIS — Z79891 Long term (current) use of opiate analgesic: Secondary | ICD-10-CM

## 2017-04-26 DIAGNOSIS — M542 Cervicalgia: Secondary | ICD-10-CM | POA: Diagnosis not present

## 2017-04-26 DIAGNOSIS — M533 Sacrococcygeal disorders, not elsewhere classified: Secondary | ICD-10-CM | POA: Diagnosis not present

## 2017-04-26 DIAGNOSIS — Z79899 Other long term (current) drug therapy: Secondary | ICD-10-CM

## 2017-04-26 DIAGNOSIS — M5136 Other intervertebral disc degeneration, lumbar region: Secondary | ICD-10-CM

## 2017-04-26 DIAGNOSIS — M899 Disorder of bone, unspecified: Secondary | ICD-10-CM

## 2017-04-26 DIAGNOSIS — M25551 Pain in right hip: Secondary | ICD-10-CM | POA: Diagnosis not present

## 2017-04-26 DIAGNOSIS — G894 Chronic pain syndrome: Secondary | ICD-10-CM | POA: Diagnosis not present

## 2017-04-26 DIAGNOSIS — G8929 Other chronic pain: Secondary | ICD-10-CM

## 2017-04-26 DIAGNOSIS — M4310 Spondylolisthesis, site unspecified: Secondary | ICD-10-CM

## 2017-04-26 DIAGNOSIS — M545 Low back pain: Secondary | ICD-10-CM | POA: Diagnosis present

## 2017-04-26 DIAGNOSIS — M47817 Spondylosis without myelopathy or radiculopathy, lumbosacral region: Secondary | ICD-10-CM | POA: Diagnosis not present

## 2017-04-26 DIAGNOSIS — M1712 Unilateral primary osteoarthritis, left knee: Secondary | ICD-10-CM | POA: Diagnosis not present

## 2017-04-26 DIAGNOSIS — M79605 Pain in left leg: Secondary | ICD-10-CM | POA: Diagnosis not present

## 2017-04-26 DIAGNOSIS — M47816 Spondylosis without myelopathy or radiculopathy, lumbar region: Secondary | ICD-10-CM

## 2017-04-26 DIAGNOSIS — Z789 Other specified health status: Secondary | ICD-10-CM

## 2017-04-26 DIAGNOSIS — M51379 Other intervertebral disc degeneration, lumbosacral region without mention of lumbar back pain or lower extremity pain: Secondary | ICD-10-CM

## 2017-04-26 DIAGNOSIS — M5030 Other cervical disc degeneration, unspecified cervical region: Secondary | ICD-10-CM

## 2017-04-26 DIAGNOSIS — R51 Headache: Secondary | ICD-10-CM

## 2017-04-26 DIAGNOSIS — M792 Neuralgia and neuritis, unspecified: Secondary | ICD-10-CM | POA: Insufficient documentation

## 2017-04-26 DIAGNOSIS — M51369 Other intervertebral disc degeneration, lumbar region without mention of lumbar back pain or lower extremity pain: Secondary | ICD-10-CM | POA: Insufficient documentation

## 2017-04-26 DIAGNOSIS — E559 Vitamin D deficiency, unspecified: Secondary | ICD-10-CM

## 2017-04-26 MED ORDER — VITAMIN D3 125 MCG (5000 UT) PO CAPS: 1.0000 | ORAL_CAPSULE | Freq: Every day | ORAL | 5 refills | Status: AC

## 2017-04-26 MED ORDER — GABAPENTIN 300 MG PO CAPS: 300.0000 mg | ORAL_CAPSULE | Freq: Three times a day (TID) | ORAL | 0 refills | Status: DC

## 2017-04-26 MED ORDER — ERGOCALCIFEROL 1.25 MG (50000 UT) PO CAPS: 50000.0000 [IU] | ORAL_CAPSULE | ORAL | 0 refills | Status: AC

## 2017-04-26 MED ORDER — MAGNESIUM 500 MG PO CAPS: 500.0000 mg | ORAL_CAPSULE | Freq: Two times a day (BID) | ORAL | 5 refills | Status: AC

## 2017-04-26 MED ORDER — GNP CALCIUM 1200 1200-1000 MG-UNIT PO CHEW: 1200.0000 mg | CHEWABLE_TABLET | Freq: Every day | ORAL | 5 refills | Status: DC

## 2017-04-26 NOTE — Patient Instructions (Addendum)
____________________________________________________________________________________________  Gabapentin Titration  Medication used: Gabapentin (Generic Name) or Neurontin (Brand Name) 300 mg tablets/capsules  Reasons to stop increasing the dose:  Reason 1: You get good relief of symptoms, in which case there is no need to increase the daily dose any further.    Reason 2: You develop some side effects, such as sleeping all of the time, difficulty concentrating, or becoming disoriented, in which case you need to go down on the dose, to the prior level, where you were not experiencing any side effects. Stay on that dose longer, to allow more time for your body to get use it, before attempting to increase it again.   Reasons to stop increasing the dose: Reason 1: You get good relief of symptoms, in which case there is no need to increase the daily dose any further.  Reason 2: You develop some side effects, such as sleeping all of the time, difficulty concentrating, or becoming disoriented, in which case you need to go down on the dose, to the prior level, where you were not experiencing any side effects. Stay on that dose longer, to allow more time for your body to get use it, before attempting to increase it again.  Steps to increase medication: Step 1: Start by taking 1 (one) tablet at bedtime x 7 (seven) days.  Step 2: After 7 (seven) days of taking 1 (one) tablet at bedtime, increase it to 2 (two) tablets at bedtime. Stay on this dose x 7 (seven) days.  Step 3: After 7 (seven) days of taking 2 (two) tablets at bedtime, increase it to 3 (three) tablets at bedtime. Stay on this dose x another 7 (seven) days.  Step 4: After 7 (seven) days of taking 3 (three) tablet at bedtime, begin taking 1 (one) tablet at noon with lunch. Stay on this dose x another 7 (seven) days.  Step 5: After 7 (seven) days of taking 3 (three) tablet at bedtime, and 1 (one) tablet at noon, then begin taking 1 (one)  tablet in the afternoon with dinner. Stay on this dose x another 7 (seven) days.  Step 6: After 7 (seven) days of taking 3 (three) tablet at bedtime, 1 (one) tablet at noon, and 1 (one) tablet in the afternoon, then begin taking 1 (one) tablet in the morning with breakfast. Stay on this dose x another 7 (seven) days. At this point you should be taking the medicine 4 (four) times a day, or about every 6 (six) hours. This daily regimen of taking the medicine 4 (four) times a day, will be maintained from now on. You should not take any doses any sooner than every 6 (six) hours.  Step 7: After 7 (seven) days of taking 3 (three) tablet at bedtime, 1 (one) tablet at noon, 1 (one) tablet in the afternoon, and 1 (one) tablet in the morning, begin taking 2 (two) tablets at noon with lunch. Stay on this dose x another 7 (seven) days.   Step 8: After 7 (seven) days of taking 3 (three) tablet at bedtime, 2 (two) tablets at noon, 1 (one) tablet in the afternoon, and 1 (one) tablet in the morning, begin taking 2 (two) tablets in the afternoon with dinner. Stay on this dose x another 7 (seven) days.   Step 9: After 7 (seven) days of taking 3 (three) tablet at bedtime, 2 (two) tablets at noon, 2 (two) tablets in the afternoon, and 1 (one) tablet in the morning, begin taking 2 (two) tablets in  the morning with breakfast. Stay on this dose x another 7 (seven) days. At this point you should be taking the medicine 4 (four) times a day, or about every 6 (six) hours. This daily regimen of taking the medicine 4 (four) times a day, will be maintained from now on. You should not take any doses any sooner than every 6 (six) hours.  Step 10: After 7 (seven) days of taking 3 (three) tablet at bedtime, 2 (two) tablets at noon, 2 (two) tablets in the afternoon, and 2 (two) tablets in the morning, begin taking 3 (three) tablets at noon with lunch. Stay on this dose x another 7 (seven) days.   Step 11: After 7 (seven) days of taking 3  (three) tablet at bedtime, 3 (three) tablets at noon, 2 (two) tablets in the afternoon, and 2 (two) tablets in the morning, begin taking 3 (three) tablets in the afternoon with dinner. Stay on this dose x another 7 (seven) days.   Step 12: After 7 (seven) days of taking 3 (three) tablet at bedtime, 3 (three) tablets at noon, 3 (three) tablets in the afternoon, and 2 (two) tablet in the morning, begin taking 3 (three) tablets in the morning with breakfast. Stay on this dose x another 7 (seven) days. At this point you should be taking the medicine 4 (four) times a day, or about every 6 (six) hours. This daily regimen of taking the medicine 4 (four) times a day, will be maintained from now on.   Endpoint: Once you have reached the maximum dose you can tolerate without side-effects, contact your physician so as to evaluate the results of the regimen.   Questions: Feel free to contact us for any questions or problems at [phone] ____________________________________________________________________________________________  ____________________________________________________________________________________________  Preparing for Procedure with Sedation  Instructions: . Oral Intake: Do not eat or drink anything for at least 8 hours prior to your procedure. . Transportation: Public transportation is not allowed. Bring an adult driver. The driver must be physically present in our waiting room before any procedure can be started. [place] [place] Physical Assistance: Bring an adult physically capable of assisting you, in the event you need help. This adult should keep you company at home for at least 6 hours after the procedure. . Blood Pressure Medicine: Take your blood pressure medicine with a sip of water the morning of the procedure. . Blood thinners:  . Diabetics on insulin: Notify the staff so that you can be scheduled 1st case in the morning. If your diabetes requires high dose insulin, take only  of your  normal insulin dose the morning of the procedure and notify the staff that you have done so. . Preventing infections: Shower with an antibacterial soap the morning of your procedure. . Build-up your immune system: Take 1000 mg of Vitamin C with every meal (3 times a day) the day prior to your procedure. [place] Antibiotics: Inform the staff if you have a condition or reason that requires you to take antibiotics before dental procedures. . Pregnancy: If you are pregnant, call and cancel the procedure. . Sickness: If you have a cold, fever, or any active infections, call and cancel the procedure. . Arrival: You must be in the facility at least 30 minutes prior to your scheduled procedure. . Children: Do not bring children with you. . Dress appropriately: Bring dark clothing that you would not mind if they get stained. . Valuables: Do not bring any jewelry or valuables.  Procedure appointments are reserved  for interventional treatments only. [place] Kitchen. No Prescription Refills. . No medication changes will be discussed during procedure appointments. . No disability issues will be discussed.  Remember:  Regular Business hours are:  Monday to Thursday 8:00 AM to 4:00 PM  Provider's Schedule: Delano MetzFrancisco Maple Odaniel, MD:  Procedure days: Tuesday and Thursday 7:30 AM to 4:00 PM  Edward JollyBilal Lateef, MD:  Procedure days: Monday and Wednesday 7:30 AM to 4:00 PM ____________________________________________________________________________________________   Instructed to pick prescriptions up at pharmacy and take as discussed.  Discussed preparing for procedures.

## 2017-04-26 NOTE — Progress Notes (Addendum)
Patient's Name: Lance Carr  MRN: [id number]  Referring Provider: Mikey College, *  DOB: [birth year]  PCP: Mikey College, NP  DOS: 04/26/2017  Note by: Gaspar Cola, MD  Service setting: Ambulatory outpatient  Specialty: Interventional Pain Management  Location: [facility] (AMB) Pain Management Facility    Patient type: Established   Primary Reason(s) for Visit: Encounter for evaluation before starting new chronic pain management plan of care (Level of risk: moderate) CC: Back Pain (lower)  HPI  Lance Carr is a 47 y.o. year old, male patient, who comes today for a follow-up evaluation to review the test results and decide on a treatment plan. He has Chronic low back pain (Primary Area of Pain) (Bilateral) (L>R) w/ left-sided sciatica; Chronic lower extremity pain (Secondary Area of Pain) (Left); Cervicogenic headache (Fourth Area of Pain); Chronic knee pain (Fifth Area of Pain) (Left); Chronic sacroiliac joint pain (Bilateral) (L>R); Chronic hip pain (Bilateral) (L>R); Chronic pain syndrome; Long term current use of opiate analgesic; Pharmacologic therapy; Disorder of skeletal system; Problems influencing health status; Chronic groin pain (Secondary Area of Pain) (Left); Chronic neck pain (Tertiary Area of Pain) (Midline); DDD (degenerative disc disease), cervical; DDD (degenerative disc disease), lumbar; DDD (degenerative disc disease), lumbosacral; Lumbar facet hypertrophy (L5-S1) (Bilateral) (R>L); Lumbar facet syndrome (Bilateral); Osteoarthritis of knee (Left); Cervicalgia; Vitamin D insufficiency; Spondylosis without myelopathy or radiculopathy, cervical region; Spondylosis without myelopathy or radiculopathy, lumbosacral region; Cervical facet syndrome; Other specified dorsopathies, sacral and sacrococcygeal region; Grade 1 Anterolisthesis of L5 over S1; L5 Pars Defect (Bilateral); Chronic musculoskeletal pain; Neurogenic pain; Arthralgia of hip or thigh (Left); and Arthralgia of  knee (Left) on their problem list. His primarily concern today is the Back Pain (lower)  Pain Assessment: Location: Lower, Left, Right Back Radiating: Radiates down both hip to groin area, right leg is numb at times Onset: More than a month ago Duration: Chronic pain Quality: Constant, Aching, Stabbing Severity: 6 /10 (self-reported pain score)  Note: Reported level is compatible with observation.                         When using our objective Pain Scale, levels between 6 and 10/10 are said to belong in an emergency room, as it progressively worsens from a 6/10, described as severely limiting, requiring emergency care not usually available at an outpatient pain management facility. At a 6/10 level, communication becomes difficult and requires great effort. Assistance to reach the emergency department may be required. Facial flushing and profuse sweating along with potentially dangerous increases in heart rate and blood pressure will be evident. Effect on ADL: pain with daily activites Timing: Constant Modifying factors: nothing  Lance Carr comes in today for a follow-up visit after his initial evaluation on 03/08/2017. Today we went over the results of his tests. These were explained in "Layman's terms". During today's appointment we went over my diagnostic impression, as well as the proposed treatment plan.  According to the patient his primary area of pain is in his lower back. He suffered a 40 foot fall in 1991 while working in [place].  He denies any weakness. He denies any previous surgery. He has had epidural steroids and nerve blocks which were both effective. 6 of 7 days last time in 2018 in [place]. He admits that he has had physical therapy on various occasions along with chiropractic therapy and aqua therapy and none were effective. He denies any recent images.  His second area of  pain is in his left side groin and leg. The pain goes into his left testicle and down the back  of the leg to the knee. He admits that he has occasional numbness and tingling in his feet.  His third area of pain is in his neck. He states is midline. He describes it as a burning feeling. He denies any numbness tingling or weakness at upper extremities. He denies any surgeries, interventional therapy or recent images.  His fourth area of pain is headaches. He admits that they are in front and back of the head. He does receive Botox injections (Dr Marciano Sequin in [place]) with this which is effective.  His next area of pain is in his left knee. He admits that he does have some swelling and weakness. He did have surgery after the accident for possible meniscus. Had surgery in knee after the fall. He admits that he had previous physical therapy but denies any recent images.  He admits that he is currently having new hip pain started approximately 2-3 months ago. She admits that this is bilateral he is unable to lay on his sides. He does feel like this is getting worse. He denies any previous surgery, interventional therapy, physical therapy or images.  In considering the treatment plan options, Mr. Bolz was reminded that I no longer take patients for medication management only. I asked him to let me know if he had no intention of taking advantage of the interventional therapies, so that we could make arrangements to provide this space to someone interested. I also made it clear that undergoing interventional therapies for the purpose of getting pain medications is very inappropriate on the part of a patient, and it will not be tolerated in this practice. This type of behavior would suggest true addiction and therefore it requires referral to an addiction specialist.   Further details on both, my assessment(s), as well as the proposed treatment plan, please see below.  Controlled Substance Pharmacotherapy Assessment REMS (Risk Evaluation and Mitigation Strategy)  Analgesic: Oxycodone 15 mg 4 times a  day (last fill date 09/29/2016) oxycodone 60 mg Highest recorded MME/day: 120 mg/day MME/day: 90 mg/day. Pill Count: None expected due to no prior prescriptions written by our practice. No notes on file Pharmacokinetics: Liberation and absorption (onset of action): WNL Distribution (time to peak effect): WNL Metabolism and excretion (duration of action): WNL         Pharmacodynamics: Desired effects: Analgesia: Lance Carr reports >50% benefit. Functional ability: Patient reports that medication allows him to accomplish basic ADLs Clinically meaningful improvement in function (CMIF): Sustained CMIF goals met Perceived effectiveness: Described as relatively effective, allowing for increase in activities of daily living (ADL) Undesirable effects: Side-effects or Adverse reactions: None reported Monitoring: [place] PMP: Online review of the past 50-monthperiod previously conducted. Not applicable at this point since we have not taken over the patient's medication management yet. List of other Serum/Urine Drug Screening Test(s):  No results found for: AMPHSCRSER, BARBSCRSER, BENZOSCRSER, COCAINSCRSER, COCAINSCRNUR, PCPSCRSER, THCSCRSER, THCU, [place]QU[place], [place] test(s[place] done:  Lab Results  Component Value Date   SUMMARY FINAL 03/08/2017   Last UDS on record: Summary  Date Value Ref Range Status  03/08/2017 FINAL  Final    Comment:    ==================================================================== TOXASSURE COMP DRUG ANALYSIS,UR ==================================================================== Test  Result       Flag       Units Drug Present not Declared for Prescription Verification   Acetaminophen                  PRESENT      UNEXPECTED   Doxylamine                     PRESENT      UNEXPECTED   Dextromethorphan               PRESENT      UNEXPECTED   Dextrorphan/Levorphanol        PRESENT       UNEXPECTED    Dextrorphan is an expected metabolite of dextromethorphan, an    over-the-counter or prescription cough suppressant. Dextrorphan    cannot be distinguished from the scheduled prescription    medication levorphanol by the method used for analysis. Drug Absent but Declared for Prescription Verification   Oxycodone                      Not Detected UNEXPECTED ng/mg creat   Tizanidine                     Not Detected UNEXPECTED    Tizanidine, as indicated in the declared medication list, is not    always detected even when used as directed. ==================================================================== Test                      Result    Flag   Units      Ref Range   Creatinine              79               mg/dL      >=20 ==================================================================== Declared Medications:  The flagging and interpretation on this report are based on the  following declared medications.  Unexpected results may arise from  inaccuracies in the declared medications.  **Note: The testing scope of this panel includes these medications:  Oxycodone  **Note: The testing scope of this panel does not include small to  moderate amounts of these reported medications:  Tizanidine  **Note: The testing scope of this panel does not include following  reported medications:  Fenoprofen ==================================================================== For clinical consultation, please call [phone]. ====================================================================    UDS interpretation: No unexpected findings.          Medication Assessment Form: Patient introduced to form today Treatment compliance: Treatment may start today if patient agrees with proposed plan. Evaluation of compliance is not applicable at this point Risk Assessment Profile: Aberrant behavior: See initial evaluations. None observed or detected today Comorbid factors increasing risk  of overdose: See initial evaluation. No additional risks detected today Medical Psychology Evaluation: Please see scanned results in medical record. Opioid Risk Tool - 03/08/17 0814      Family History of Substance Abuse   Alcohol  Negative    Illegal Drugs  Negative    Rx Drugs  Negative      Personal History of Substance Abuse   Alcohol  Negative    Illegal Drugs  Negative    Rx Drugs  Negative      Psychological Disease   Depression  Negative      Total Score   Opioid Risk Tool Scoring  0    Opioid Risk Interpretation  Low Risk  ORT Scoring interpretation table:  Score <3 = Low Risk for SUD  Score between 4-7 = Moderate Risk for SUD  Score >8 = High Risk for Opioid Abuse   Risk Mitigation Strategies:  Patient opioid safety counseling: Completed today. Counseling provided to patient as per "Patient Counseling Document". Document signed by patient, attesting to counseling and understanding Patient-Prescriber Agreement (PPA): Obtained today.  Controlled substance notification to other providers: Written and sent today.  Pharmacologic Plan: Today we may be taking over the patient's pharmacological regimen. See below.             Laboratory Chemistry  Inflammation Markers (CRP: Acute Phase) (ESR: Chronic Phase) Lab Results  Component Value Date   CRP <0.8 03/08/2017   ESRSEDRATE 8 03/08/2017                         Renal Function Markers Lab Results  Component Value Date   BUN 12 03/08/2017   CREATININE 0.80 03/08/2017   GFRAA >60 03/08/2017   GFRNONAA >60 03/08/2017                              Hepatic Function Markers Lab Results  Component Value Date   AST 20 03/08/2017   ALT 15 (L) 03/08/2017   ALBUMIN 4.3 03/08/2017   ALKPHOS 52 03/08/2017                        Electrolytes Lab Results  Component Value Date   NA 137 03/08/2017   K 3.8 03/08/2017   CL 104 03/08/2017   CALCIUM 8.9 03/08/2017   MG 2.1 03/08/2017                         Neuropathy Markers Lab Results  Component Value Date   VITAMINB12 246 03/08/2017                        Bone Pathology Markers Lab Results  Component Value Date   VD25OH 22.4 (L) 03/08/2017   25OHVITD1 25 (L) 03/08/2017   25OHVITD2 9.4 03/08/2017   25OHVITD3 16 03/08/2017                         Note: Lab results reviewed.  Recent Diagnostic Imaging Review  Cervical Imaging: Cervical DG complete:  Results for orders placed during the hospital encounter of 03/08/17  DG Cervical Spine Complete   Narrative CLINICAL DATA:  Back pain.  Left-sided sciatica.  EXAM: CERVICAL SPINE - COMPLETE 4+ VIEW  COMPARISON:  None.  FINDINGS: Alignment is normal. There is disc space narrowing at C6-7. Other disc heights appear normal. No significant osteophytic encroachment upon the canal or foramina. No evidence of facet arthropathy.  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  Otherwise negative.   Electronically Signed   By: Nelson Chimes M.D.   On: 03/08/2017 11:19    Lumbosacral Imaging: Lumbar DG Bending views:  Results for orders placed during the hospital encounter of 03/08/17  DG Lumbar Spine Complete W/Bend   Narrative CLINICAL DATA:  Chronic bilateral low back pain. Left-sided sciatica.  EXAM: LUMBAR SPINE - COMPLETE WITH BENDING VIEWS  COMPARISON:  None.  FINDINGS: Bilateral L5 pars defects are present. Grade 1 anterolisthesis at L5-S1 is exaggerated with standing and flexion. It is partially reduced in extension. AP alignment  is otherwise anatomic.  Facet disease at L5-S1 is worse on the right.  Mild rightward curvature of the upper lumbar spine is noted.  IMPRESSION: 1. 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 2. Right greater than left facet hypertrophy at L5-S1.   Electronically Signed   By: San Morelle M.D.   On: 03/08/2017 11:22    Sacroiliac Joint Imaging: Sacroiliac Joint DG:  Results for orders placed during the hospital  encounter of 03/08/17  DG Si Joints   Narrative CLINICAL DATA:  47 year old male with chronic lumbar back pain, left side sciatica and left knee pain.  EXAM: BILATERAL SACROILIAC JOINTS - 3+ VIEW  COMPARISON:  Lumbar radiographs today reported separately.  FINDINGS: Bone mineralization is within normal limits. Normal sacral ala. The SI joints appear symmetric and within normal limits. L5-S1 disc space loss is evident. No acute osseous abnormality identified.  IMPRESSION: 1. Normal radiographic appearance of the SI joints. 2. L5-S1 disc degeneration, see lumbar series reported separately.   Electronically Signed   By: Genevie Ann M.D.   On: 03/08/2017 11:24    Hip Imaging: Hip-L DG 2-3 views:  Results for orders placed during the hospital encounter of 03/08/17  DG HIP UNILAT W OR W/O PELVIS 2-3 VIEWS LEFT   Narrative CLINICAL DATA:  47 year old male with chronic lumbar back pain, left side sciatica, knee pain.  EXAM: DG HIP (WITH OR WITHOUT PELVIS) 2-3V LEFT  COMPARISON:  None.  FINDINGS: Bone mineralization is within normal limits. Femoral heads are normally located. Hip joint spaces appear symmetric and normal. The intact pelvis. Grossly normal proximal right femur. Normal proximal left femur. The sacral ala and SI joints appear normal. Negative visible bowel gas pattern.  IMPRESSION: Normal radiographic appearance of the left hip and pelvis.   Electronically Signed   By: Genevie Ann M.D.   On: 03/08/2017 11:22    Knee Imaging: Knee-L DG 1-2 views:  Results for orders placed during the hospital encounter of 03/08/17  DG Knee 1-2 Views Left   Narrative CLINICAL DATA:  47 year old male with chronic lumbar back pain, left side sciatica, knee pain.  EXAM: LEFT KNEE - 1-2 VIEW  COMPARISON:  None.  FINDINGS: Medial compartment and patellofemoral compartment joint space loss with mild to moderate degenerative spurring. No definite joint effusion. No acute osseous  abnormality identified. Normal underlying bone mineralization.  IMPRESSION: Medial and patellofemoral compartment dominant degenerative changes at the left knee. No acute osseous abnormality identified.   Electronically Signed   By: Genevie Ann M.D.   On: 03/08/2017 11:21    Complexity Note: Imaging results reviewed. Results shared with Lance Carr, using Layman's terms.                         Meds   Current Outpatient Medications:  .  cyclobenzaprine (FLEXERIL) 10 MG tablet, Take 10 mg by mouth 3 (three) times daily as needed for muscle spasms., Disp: , Rfl:  .  DULoxetine (CYMBALTA) 30 MG capsule, Take 30 mg by mouth daily., Disp: , Rfl:  .  fenoprofen (NALFON) 600 MG TABS tablet, Take 600 mg by mouth 3 (three) times daily., Disp: , Rfl:  .  ibuprofen (ADVIL,MOTRIN) 200 MG tablet, Take 600 mg by mouth every 6 (six) hours as needed for moderate pain., Disp: , Rfl:  .  Calcium Carbonate-Vit D-Min (GNP CALCIUM 1200) 1200-1000 MG-UNIT CHEW, Chew 1,200 mg by mouth daily with breakfast. Take in combination with vitamin D and  magnesium., Disp: 30 tablet, Rfl: 5 .  Cholecalciferol (VITAMIN D3) 5000 units CAPS, Take 1 capsule (5,000 Units total) by mouth daily with breakfast. Take along with calcium and magnesium., Disp: 30 capsule, Rfl: 5 .  ergocalciferol (VITAMIN D2) 50000 units capsule, Take 1 capsule (50,000 Units total) by mouth 2 (two) times a week. X 6 weeks., Disp: 12 capsule, Rfl: 0 .  gabapentin (NEURONTIN) 300 MG capsule, Take 1 capsule (300 mg total) by mouth every 8 (eight) hours., Disp: 90 capsule, Rfl: 0 .  Magnesium 500 MG CAPS, Take 1 capsule (500 mg total) by mouth 2 (two) times daily at 8 am and 10 pm., Disp: 60 capsule, Rfl: 5  ROS  Constitutional: Denies any fever or chills Gastrointestinal: No reported hemesis, hematochezia, vomiting, or acute GI distress Musculoskeletal: Denies any acute onset joint swelling, redness, loss of ROM, or weakness Neurological: No reported  episodes of acute onset apraxia, aphasia, dysarthria, agnosia, amnesia, paralysis, loss of coordination, or loss of consciousness  Allergies  Lance Carr has No Known Allergies.  Paul  Drug: Lance Carr  reports that he does not use drugs. Alcohol:  reports that he does not drink alcohol. Tobacco:  reports that he quit smoking about 12 years ago. He quit after 20.00 years of use. He has never used smokeless tobacco. Medical:  has a past medical history of Anxiety, Arthritis, Chronic back pain, Colon polyp, Fibromyalgia, Frequent headaches, History of blood clots, Hypoglycemia, Sleep apnea, and Thyroid disorder. Surgical: Lance Carr  has a past surgical history that includes Thyroid surgery (Left); Vein Surgery; Gastric bypass (10/2011); and Meniscus repair (Left). Family: family history includes Colon cancer in his paternal uncle; Diabetes in his father; Healthy in his maternal grandmother; Heart disease in his father and maternal grandfather; Osteoarthritis in his mother; Stroke in his maternal grandfather; Varicose Veins in his mother.  Constitutional Exam  General appearance: Well nourished, well developed, and well hydrated. In no apparent acute distress Vitals:   04/26/17 0948  BP: (!) 131/96  Pulse: 69  Temp: 97.7 F (36.5 C)  SpO2: 100%  Weight: 270 lb (122.5 kg)  Height: '5\' 9"'  (1.753 m)   BMI Assessment: Estimated body mass index is 39.87 kg/m as calculated from the following:   Height as of this encounter: '5\' 9"'  (1.753 m).   Weight as of this encounter: 270 lb (122.5 kg).  BMI interpretation table: BMI level Category Range association with higher incidence of chronic pain  <18 kg/m2 Underweight   18.5-24.9 kg/m2 Ideal body weight   25-29.9 kg/m2 Overweight Increased incidence by 20%  30-34.9 kg/m2 Obese (Class I) Increased incidence by 68%  35-39.9 kg/m2 Severe obesity (Class II) Increased incidence by 136%  >40 kg/m2 Extreme obesity (Class III) Increased incidence by  254%   BMI Readings from Last 4 Encounters:  04/26/17 39.87 kg/m  03/08/17 42.83 kg/m  03/01/17 41.88 kg/m   Wt Readings from Last 4 Encounters:  04/26/17 270 lb (122.5 kg)  03/08/17 290 lb (131.5 kg)  03/01/17 289 lb 12.8 oz (131.5 kg)  Psych/Mental status: Alert, oriented x 3 (person, place, & time)       Eyes: PERLA Respiratory: No evidence of acute respiratory distress  Cervical Spine Area Exam  Skin & Axial Inspection: No masses, redness, edema, swelling, or associated skin lesions Alignment: Symmetrical Functional ROM: Unrestricted ROM      Stability: No instability detected Muscle Tone/Strength: Functionally intact. No obvious neuro-muscular anomalies detected. Sensory (Neurological): Unimpaired Palpation: No palpable anomalies  Upper Extremity (UE) Exam    Side: Right upper extremity  Side: Left upper extremity  Skin & Extremity Inspection: Skin color, temperature, and hair growth are WNL. No peripheral edema or cyanosis. No masses, redness, swelling, asymmetry, or associated skin lesions. No contractures.  Skin & Extremity Inspection: Skin color, temperature, and hair growth are WNL. No peripheral edema or cyanosis. No masses, redness, swelling, asymmetry, or associated skin lesions. No contractures.  Functional ROM: Unrestricted ROM          Functional ROM: Unrestricted ROM          Muscle Tone/Strength: Functionally intact. No obvious neuro-muscular anomalies detected.  Muscle Tone/Strength: Functionally intact. No obvious neuro-muscular anomalies detected.  Sensory (Neurological): Unimpaired          Sensory (Neurological): Unimpaired          Palpation: No palpable anomalies              Palpation: No palpable anomalies              Specialized Test(s): Deferred         Specialized Test(s): Deferred          Thoracic Spine Area Exam  Skin & Axial Inspection: No masses, redness, or swelling Alignment: Symmetrical Functional ROM: Unrestricted  ROM Stability: No instability detected Muscle Tone/Strength: Functionally intact. No obvious neuro-muscular anomalies detected. Sensory (Neurological): Unimpaired Muscle strength & Tone: No palpable anomalies  Lumbar Spine Area Exam  Skin & Axial Inspection: No masses, redness, or swelling Alignment: Symmetrical Functional ROM: Decreased ROM      Stability: No instability detected Muscle Tone/Strength: Functionally intact. No obvious neuro-muscular anomalies detected. Sensory (Neurological): Movement-associated pain Palpation: Complains of area being tender to palpation       Provocative Tests: Lumbar Hyperextension and rotation test: Positive bilaterally for facet joint pain. Lumbar Lateral bending test: evaluation deferred today       Patrick's Maneuver: evaluation deferred today                    Gait & Posture Assessment  Ambulation: Unassisted Gait: Relatively normal for age and body habitus Posture: Antalgic   Lower Extremity Exam    Side: Right lower extremity  Side: Left lower extremity  Skin & Extremity Inspection: Skin color, temperature, and hair growth are WNL. No peripheral edema or cyanosis. No masses, redness, swelling, asymmetry, or associated skin lesions. No contractures.  Skin & Extremity Inspection: Skin color, temperature, and hair growth are WNL. No peripheral edema or cyanosis. No masses, redness, swelling, asymmetry, or associated skin lesions. No contractures.  Functional ROM: Unrestricted ROM          Functional ROM: Unrestricted ROM          Muscle Tone/Strength: Functionally intact. No obvious neuro-muscular anomalies detected.  Muscle Tone/Strength: Functionally intact. No obvious neuro-muscular anomalies detected.  Sensory (Neurological): Unimpaired  Sensory (Neurological): Unimpaired  Palpation: No palpable anomalies  Palpation: No palpable anomalies   Assessment & Plan  Primary Diagnosis & Pertinent Problem List: The primary encounter diagnosis was  Chronic low back pain (Primary Area of Pain) (Bilateral) (L>R) w/ left-sided sciatica. Diagnoses of DDD (degenerative disc disease), lumbar, DDD (degenerative disc disease), lumbosacral, Grade 1 Anterolisthesis of L5 over S1, L5 Pars Defect (Bilateral), Lumbar facet hypertrophy (L5-S1) (Bilateral), Spondylosis without myelopathy or radiculopathy, lumbosacral region, Lumbar facet syndrome (Bilateral), Chronic sacroiliac joint pain (Bilateral) (L>R), Other specified dorsopathies, sacral and sacrococcygeal region, Chronic groin pain (Secondary Area of Pain) (Left),  Chronic hip pain (Bilateral) (L>R), Arthralgia of hip or thigh (Left), Chronic lower extremity pain (Secondary Area of Pain) (Left), Chronic knee pain (Fifth Area of Pain) (Left), Arthralgia of knee (Left), Osteoarthritis of knee (Left), Chronic neck pain (Tertiary Area of Pain) (Midline), Cervicalgia, Cervical facet syndrome, Spondylosis without myelopathy or radiculopathy, cervical region, DDD (degenerative disc disease), cervical, Cervicogenic headache (Fourth Area of Pain), Chronic pain syndrome, Disorder of skeletal system, Pharmacologic therapy, Long term current use of opiate analgesic, Problems influencing health status, Vitamin D insufficiency, Chronic musculoskeletal pain, and Neurogenic pain were also pertinent to this visit.  Visit Diagnosis: 1. Chronic low back pain (Primary Area of Pain) (Bilateral) (L>R) w/ left-sided sciatica   2. DDD (degenerative disc disease), lumbar   3. DDD (degenerative disc disease), lumbosacral   4. Grade 1 Anterolisthesis of L5 over S1   5. L5 Pars Defect (Bilateral)   6. Lumbar facet hypertrophy (L5-S1) (Bilateral)   7. Spondylosis without myelopathy or radiculopathy, lumbosacral region   8. Lumbar facet syndrome (Bilateral)   9. Chronic sacroiliac joint pain (Bilateral) (L>R)   10. Other specified dorsopathies, sacral and sacrococcygeal region   11. Chronic groin pain (Secondary Area of Pain) (Left)    12. Chronic hip pain (Bilateral) (L>R)   13. Arthralgia of hip or thigh (Left)   14. Chronic lower extremity pain (Secondary Area of Pain) (Left)   15. Chronic knee pain (Fifth Area of Pain) (Left)   16. Arthralgia of knee (Left)   17. Osteoarthritis of knee (Left)   18. Chronic neck pain (Tertiary Area of Pain) (Midline)   19. Cervicalgia   20. Cervical facet syndrome   21. Spondylosis without myelopathy or radiculopathy, cervical region   22. DDD (degenerative disc disease), cervical   23. Cervicogenic headache (Fourth Area of Pain)   24. Chronic pain syndrome   25. Disorder of skeletal system   26. Pharmacologic therapy   27. Long term current use of opiate analgesic   28. Problems influencing health status   29. Vitamin D insufficiency   30. Chronic musculoskeletal pain   31. Neurogenic pain    Problems updated and reviewed during this visit: No problems updated.  Time Note: Greater than 50% of the 40 minute(s) of face-to-face time spent with Lance Carr, was spent in counseling/coordination of care regarding: the appropriate use of the pain scale, Lance Carr primary cause of pain, the results of his recent test(s), the significance of each one oth the test(s) anomalies and it's corresponding characteristic pain pattern(s), the treatment plan, treatment alternatives, the risks and possible complications of proposed treatment, realistic expectations, the goals of pain management (increased in functionality) and the need to collect and read the AVS material.  Plan of Care  Pharmacotherapy (Medications Ordered): Meds ordered this encounter  Medications  . ergocalciferol (VITAMIN D2) 50000 units capsule    Sig: Take 1 capsule (50,000 Units total) by mouth 2 (two) times a week. X 6 weeks.    Dispense:  12 capsule    Refill:  0    Do not add this medication to the electronic "Automatic Refill" notification system. Patient may have prescription filled one day early if pharmacy is  closed on scheduled refill date.  . Cholecalciferol (VITAMIN D3) 5000 units CAPS    Sig: Take 1 capsule (5,000 Units total) by mouth daily with breakfast. Take along with calcium and magnesium.    Dispense:  30 capsule    Refill:  5    Do not place  medication on "Automatic Refill".  May substitute with similar over-the-counter product.  . Magnesium 500 MG CAPS    Sig: Take 1 capsule (500 mg total) by mouth 2 (two) times daily at 8 am and 10 pm.    Dispense:  60 capsule    Refill:  5    Do not place medication on "Automatic Refill".  The patient may use similar over-the-counter product.  . Calcium Carbonate-Vit D-Min (GNP CALCIUM 1200) 1200-1000 MG-UNIT CHEW    Sig: Chew 1,200 mg by mouth daily with breakfast. Take in combination with vitamin D and magnesium.    Dispense:  30 tablet    Refill:  5    Do not place medication on "Automatic Refill".  May substitute with similar over-the-counter product.  Marland Kitchen gabapentin (NEURONTIN) 300 MG capsule    Sig: Take 1 capsule (300 mg total) by mouth every 8 (eight) hours.    Dispense:  90 capsule    Refill:  0    Do not place this medication, or any other prescription from our practice, on "Automatic Refill". Patient may have prescription filled one day early if pharmacy is closed on scheduled refill date.    Procedure Orders     LUMBAR FACET(MEDIAL BRANCH NERVE BLOCK) MBNB Lab Orders  No laboratory test(s) ordered today   Imaging Orders  No imaging studies ordered today   Referral Orders  No referral(s) requested today    Pharmacological management options:  Opioid Analgesics: We'll take over management today. See above orders Membrane stabilizer: We have discussed the possibility of optimizing this mode of therapy, if tolerated Muscle relaxant: We have discussed the possibility of a trial NSAID: We have discussed the possibility of a trial Other analgesic(s): To be determined at a later time   Interventional management options: Planned,  scheduled, and/or pending:    Diagnostic bilateral lumbar facet block #1 under fluoroscopic guidance and IV sedation   Considering:   Diagnostic bilateral lumbar facet block  Possible bilateral lumbar facet RFA  Diagnostic bilateral sacroiliac joint block  Possible bilateral sacroiliac joint RFA  Diagnostic left-sided L5-S1 interlaminar LESI  Diagnostic left-sided L5 transforaminal LESI  Diagnostic left-sided intra-articular hip joint injection  Diagnostic left-sided femoral nerve + obturator nerve block  Possible left-sided femoral nerve + obturator nerve RFA  Diagnostic left intra-articular knee joint injection with local anesthetic and steroid  Possible left series of 5 intra-articular Hyalgan knee injections  Diagnostic left genicular nerve block  Possible left genicular nerve RFA  Diagnostic bilateral cervical facet block  Possible bilateral cervical facet RFA  Diagnostic Cervical epidural steroid injection  Diagnostic bilateral occipital nerve block  Diagnostic bilateral C2 + TON nerve block Possible bilateral C2 + TON RFA  Possible occipital Botox injections    PRN Procedures:   None at this time   Provider-requested follow-up: Return for Procedure (w/ sedation): (B) L-FCT BLK #1.  Future Appointments  Date Time Provider [place]  05/04/2017  8:15 AM Milinda Pointer, MD [facility] None  05/29/2017  9:00 AM Mikey College, NP [facility] None    Primary Care Physician: Mikey College, NP Location: [facility] Facility Note by: Gaspar Cola, MD Date: 04/26/2017; Time: 10:54 AM

## 2017-05-04 ENCOUNTER — Ambulatory Visit: Payer: Medicare Other | Admitting: Pain Medicine

## 2017-05-04 NOTE — Progress Notes (Deleted)
Patient's Name: Lance Carr  MRN: [id number]  Referring Provider: Galen Manila, *  DOB: 1970-07-26  PCP: Galen Manila, NP  DOS: 05/04/2017  Note by: Oswaldo Done, MD  Service setting: Ambulatory outpatient  Specialty: Interventional Pain Management  Patient type: Established  Location: [facility] (AMB) Pain Management Facility  Visit type: Interventional Procedure   Primary Reason for Visit: Interventional Pain Management Treatment. CC: No chief complaint on file.  Procedure:       Anesthesia, Analgesia, Anxiolysis:  Type: Lumbar Facet, Medial Branch Block(s) #1  Primary Purpose: Diagnostic Region: Posterolateral Lumbosacral Spine Level: L2, L3, L4, L5, & S1 Medial Branch Level(s). Injecting these levels blocks the L3-4, L4-5, and L5-S1 lumbar facet joints. Laterality: Bilateral  Type: Moderate (Conscious) Sedation combined with Local Anesthesia Indication(s): Analgesia and Anxiety Route: Intravenous (IV) IV Access: Secured Sedation: Meaningful verbal contact was maintained at all times during the procedure  Local Anesthetic: Lidocaine 1-2%   Indications: 1. Spondylosis without myelopathy or radiculopathy, lumbosacral region   2. Lumbar facet syndrome (Bilateral)   3. Lumbar facet hypertrophy (L5-S1) (Bilateral) (R>L)   4. Chronic low back pain (Primary Area of Pain) (Bilateral) (L>R) w/ left-sided sciatica    Pain Score: Pre-procedure:  /10 Post-procedure:  /10  Pre-op Assessment:  Lance Carr is a 47 y.o. (year old), male patient, seen today for interventional treatment. He  has a past surgical history that includes Thyroid surgery (Left); Vein Surgery; Gastric bypass (10/2011); and Meniscus repair (Left). Lance Carr has a current medication list which includes the following prescription(s): gnp calcium 1200, vitamin d3, cyclobenzaprine, duloxetine, ergocalciferol, fenoprofen, gabapentin, ibuprofen, and magnesium. His primarily concern today is the No chief  complaint on file.  Initial Vital Signs:  Pulse/HCG Rate:    Temp:   Resp:   BP:   SpO2:    BMI: Estimated body mass index is 39.87 kg/m as calculated from the following:   Height as of 04/26/17: 5\' 9"  (1.753 m).   Weight as of 04/26/17: 270 lb (122.5 kg).  Risk Assessment: Allergies: Reviewed. He has No Known Allergies.  Allergy Precautions: None required Coagulopathies: Reviewed. None identified.  Blood-thinner therapy: None at this time Active Infection(s): Reviewed. None identified. Lance Carr is afebrile  Site Confirmation: Lance Carr was asked to confirm the procedure and laterality before marking the site Procedure checklist: Completed Consent: Before the procedure and under the influence of no sedative(s), amnesic(s), or anxiolytics, the patient was informed of the treatment options, risks and possible complications. To fulfill our ethical and legal obligations, as recommended by the American Medical Association's Code of Ethics, I have informed the patient of my clinical impression; the nature and purpose of the treatment or procedure; the risks, benefits, and possible complications of the intervention; the alternatives, including doing nothing; the risk(s) and benefit(s) of the alternative treatment(s) or procedure(s); and the risk(s) and benefit(s) of doing nothing. The patient was provided information about the general risks and possible complications associated with the procedure. These may include, but are not limited to: failure to achieve desired goals, infection, bleeding, organ or nerve damage, allergic reactions, paralysis, and death. In addition, the patient was informed of those risks and complications associated to Spine-related procedures, such as failure to decrease pain; infection (i.e.: Meningitis, epidural or intraspinal abscess); bleeding (i.e.: epidural hematoma, subarachnoid hemorrhage, or any other type of intraspinal or peri-dural bleeding); organ or nerve  damage (i.e.: Any type of peripheral nerve, nerve root, or spinal cord injury) with subsequent damage  to sensory, motor, and/or autonomic systems, resulting in permanent pain, numbness, and/or weakness of one or several areas of the body; allergic reactions; (i.e.: anaphylactic reaction); and/or death. Furthermore, the patient was informed of those risks and complications associated with the medications. These include, but are not limited to: allergic reactions (i.e.: anaphylactic or anaphylactoid reaction(s)); adrenal axis suppression; blood sugar elevation that in diabetics may result in ketoacidosis or comma; water retention that in patients with history of congestive heart failure may result in shortness of breath, pulmonary edema, and decompensation with resultant heart failure; weight gain; swelling or edema; medication-induced neural toxicity; particulate matter embolism and blood vessel occlusion with resultant organ, and/or nervous system infarction; and/or aseptic necrosis of one or more joints. Finally, the patient was informed that Medicine is not an exact science; therefore, there is also the possibility of unforeseen or unpredictable risks and/or possible complications that may result in a catastrophic outcome. The patient indicated having understood very clearly. We have given the patient no guarantees and we have made no promises. Enough time was given to the patient to ask questions, all of which were answered to the patient's satisfaction. Lance Carr has indicated that he wanted to continue with the procedure. Attestation: I, the ordering provider, attest that I have discussed with the patient the benefits, risks, side-effects, alternatives, likelihood of achieving goals, and potential problems during recovery for the procedure that I have provided informed consent. Date  Time: {CHL [facility]-PAIN TIME CHOICES:[id number]}  Pre-Procedure Preparation:  Monitoring: As per clinic protocol.  Respiration, ETCO2, SpO2, BP, heart rate and rhythm monitor placed and checked for adequate function Safety Precautions: Patient was assessed for positional comfort and pressure points before starting the procedure. Time-out: I initiated and conducted the "Time-out" before starting the procedure, as per protocol. The patient was asked to participate by confirming the accuracy of the "Time Out" information. Verification of the correct person, site, and procedure were performed and confirmed by me, the nursing staff, and the patient. "Time-out" conducted as per Joint Commission's Universal Protocol (UP.01.01.01). Time:    Description of Procedure:       Position: Prone Laterality: Bilateral. The procedure was performed in identical fashion on both sides. Levels:  L2, L3, L4, L5, & S1 Medial Branch Level(s) Area Prepped: Posterior Lumbosacral Region Prepping solution: ChloraPrep (2% chlorhexidine gluconate and 70% isopropyl alcohol) Safety Precautions: Aspiration looking for blood return was conducted prior to all injections. At no point did we inject any substances, as a needle was being advanced. Before injecting, the patient was told to immediately notify me if he was experiencing any new onset of "ringing in the ears, or metallic taste in the mouth". No attempts were made at seeking any paresthesias. Safe injection practices and needle disposal techniques used. Medications properly checked for expiration dates. SDV (single dose vial) medications used. After the completion of the procedure, all disposable equipment used was discarded in the proper designated medical waste containers. Local Anesthesia: Protocol guidelines were followed. The patient was positioned over the fluoroscopy table. The area was prepped in the usual manner. The time-out was completed. The target area was identified using fluoroscopy. A 12-in long, straight, sterile hemostat was used with fluoroscopic guidance to locate the targets  for each level blocked. Once located, the skin was marked with an approved surgical skin marker. Once all sites were marked, the skin (epidermis, dermis, and hypodermis), as well as deeper tissues (fat, connective tissue and muscle) were infiltrated with a small amount of a  short-acting local anesthetic, loaded on a 10cc syringe with a 25G, 1.5-in  Needle. An appropriate amount of time was allowed for local anesthetics to take effect before proceeding to the next step. Local Anesthetic: Lidocaine 2.0% The unused portion of the local anesthetic was discarded in the proper designated containers. Technical explanation of process:  L2 Medial Branch Nerve Block (MBB): The target area for the L2 medial branch is at the junction of the postero-lateral aspect of the superior articular process and the superior, posterior, and medial edge of the transverse process of L3. Under fluoroscopic guidance, a Quincke needle was inserted until contact was made with os over the superior postero-lateral aspect of the pedicular shadow (target area). After negative aspiration for blood, 0.5 mL of the nerve block solution was injected without difficulty or complication. The needle was removed intact. L3 Medial Branch Nerve Block (MBB): The target area for the L3 medial branch is at the junction of the postero-lateral aspect of the superior articular process and the superior, posterior, and medial edge of the transverse process of L4. Under fluoroscopic guidance, a Quincke needle was inserted until contact was made with os over the superior postero-lateral aspect of the pedicular shadow (target area). After negative aspiration for blood, 0.5 mL of the nerve block solution was injected without difficulty or complication. The needle was removed intact. L4 Medial Branch Nerve Block (MBB): The target area for the L4 medial branch is at the junction of the postero-lateral aspect of the superior articular process and the superior, posterior,  and medial edge of the transverse process of L5. Under fluoroscopic guidance, a Quincke needle was inserted until contact was made with os over the superior postero-lateral aspect of the pedicular shadow (target area). After negative aspiration for blood, 0.5 mL of the nerve block solution was injected without difficulty or complication. The needle was removed intact. L5 Medial Branch Nerve Block (MBB): The target area for the L5 medial branch is at the junction of the postero-lateral aspect of the superior articular process and the superior, posterior, and medial edge of the sacral ala. Under fluoroscopic guidance, a Quincke needle was inserted until contact was made with os over the superior postero-lateral aspect of the pedicular shadow (target area). After negative aspiration for blood, 0.5 mL of the nerve block solution was injected without difficulty or complication. The needle was removed intact. S1 Medial Branch Nerve Block (MBB): The target area for the S1 medial branch is at the posterior and inferior 6 o'clock position of the L5-S1 facet joint. Under fluoroscopic guidance, the Quincke needle inserted for the L5 MBB was redirected until contact was made with os over the inferior and postero aspect of the sacrum, at the 6 o' clock position under the L5-S1 facet joint (Target area). After negative aspiration for blood, 0.5 mL of the nerve block solution was injected without difficulty or complication. The needle was removed intact. Procedural Needles: 22-gauge, 3.5-inch, Quincke needles used for all levels. Nerve block solution: 0.2% PF-Ropivacaine + Triamcinolone (40 mg/mL) diluted to a final concentration of 4 mg of Triamcinolone/mL of Ropivacaine The unused portion of the solution was discarded in the proper designated containers.  Once the entire procedure was completed, the treated area was cleaned, making sure to leave some of the prepping solution back to take advantage of its long term  bactericidal properties.   Illustration of the posterior view of the lumbar spine and the posterior neural structures. Laminae of L2 through S1 are labeled. DPRL5, dorsal  primary ramus of L5; DPRS1, dorsal primary ramus of S1; DPR3, dorsal primary ramus of L3; FJ, facet (zygapophyseal) joint L3-L4; I, inferior articular process of L4; LB1, lateral branch of dorsal primary ramus of L1; IAB, inferior articular branches from L3 medial branch (supplies L4-L5 facet joint); IBP, intermediate branch plexus; MB3, medial branch of dorsal primary ramus of L3; NR3, third lumbar nerve root; S, superior articular process of L5; SAB, superior articular branches from L4 (supplies L4-5 facet joint also); TP3, transverse process of L3.  There were no vitals filed for this visit.  Start Time:   hrs. End Time:   hrs.  Imaging Guidance (Spinal):  Type of Imaging Technique: Fluoroscopy Guidance (Spinal) Indication(s): Assistance in needle guidance and placement for procedures requiring needle placement in or near specific anatomical locations not easily accessible without such assistance. Exposure Time: Please see nurses notes. Contrast: None used. Fluoroscopic Guidance: I was personally present during the use of fluoroscopy. "Tunnel Vision Technique" used to obtain the best possible view of the target area. Parallax error corrected before commencing the procedure. "Direction-depth-direction" technique used to introduce the needle under continuous pulsed fluoroscopy. Once target was reached, antero-posterior, oblique, and lateral fluoroscopic projection used confirm needle placement in all planes. Images permanently stored in EMR. Interpretation: No contrast injected. I personally interpreted the imaging intraoperatively. Adequate needle placement confirmed in multiple planes. Permanent images saved into the patient's record.  Antibiotic Prophylaxis:   Anti-infectives (From admission, onward)   None      Indication(s): None identified  Post-operative Assessment:  Post-procedure Vital Signs:  Pulse/HCG Rate:    Temp:   Resp:   BP:   SpO2:    EBL: None  Complications: No immediate post-treatment complications observed by team, or reported by patient.  Note: The patient tolerated the entire procedure well. A repeat set of vitals were taken after the procedure and the patient was kept under observation following institutional policy, for this type of procedure. Post-procedural neurological assessment was performed, showing return to baseline, prior to discharge. The patient was provided with post-procedure discharge instructions, including a section on how to identify potential problems. Should any problems arise concerning this procedure, the patient was given instructions to immediately contact us, at any time, without hesitation. In any case, we plan to contact the patient by telephone for a follow-up status report regarding this interventional procedure.  Comments:  No additional relevant information.  Plan of Care   Imaging Orders  No imaging studies ordered today   Procedure Orders    No procedure(s) ordered today    Medications ordered for procedure: No orders of the defined types were placed in this encounter.  Medications administered: Charna Busman had no medications administered during this visit.  See the medical record for exact dosing, route, and time of administration.  New Prescriptions   No medications on file   Disposition: Discharge home  Discharge Date & Time: 05/04/2017;   hrs.   Physician-requested Follow-up: No follow-ups on file.  Future Appointments  Date Time Provider Department Center  05/04/2017  8:15 AM Delano Metz, MD [facility] None  05/29/2017  9:00 AM Galen Manila, NP [facility] None   Primary Care Physician: Galen Manila, NP Location: [facility] Outpatient Pain Management Facility Note by: Oswaldo Done, MD Date:  05/04/2017; Time: 7:11 AM  Disclaimer:  Medicine is not an Visual merchandiser. The only guarantee in medicine is that nothing is guaranteed. It is important to note that the decision to proceed with  this intervention was based on the information collected from the patient. The Data and conclusions were drawn from the patient's questionnaire, the interview, and the physical examination. Because the information was provided in large part by the patient, it cannot be guaranteed that it has not been purposely or unconsciously manipulated. Every effort has been made to obtain as much relevant data as possible for this evaluation. It is important to note that the conclusions that lead to this procedure are derived in large part from the available data. Always take into account that the treatment will also be dependent on availability of resources and existing treatment guidelines, considered by other Pain Management Practitioners as being common knowledge and practice, at the time of the intervention. For Medico-Legal purposes, it is also important to point out that variation in procedural techniques and pharmacological choices are the acceptable norm. The indications, contraindications, technique, and results of the above procedure should only be interpreted and judged by a Board-Certified Interventional Pain Specialist with extensive familiarity and expertise in the same exact procedure and technique.

## 2017-05-18 ENCOUNTER — Other Ambulatory Visit: Payer: Self-pay | Admitting: Pain Medicine

## 2017-05-18 DIAGNOSIS — M792 Neuralgia and neuritis, unspecified: Secondary | ICD-10-CM

## 2017-05-29 ENCOUNTER — Ambulatory Visit: Payer: Medicare Other | Admitting: Nurse Practitioner

## 2017-06-02 ENCOUNTER — Other Ambulatory Visit: Payer: Self-pay | Admitting: Pain Medicine

## 2017-06-02 DIAGNOSIS — M792 Neuralgia and neuritis, unspecified: Secondary | ICD-10-CM

## 2017-06-21 ENCOUNTER — Other Ambulatory Visit: Payer: Self-pay

## 2017-06-21 DIAGNOSIS — I69359 Hemiplegia and hemiparesis following cerebral infarction affecting unspecified side: Secondary | ICD-10-CM | POA: Diagnosis not present

## 2017-06-21 DIAGNOSIS — G8194 Hemiplegia, unspecified affecting left nondominant side: Secondary | ICD-10-CM | POA: Diagnosis present

## 2017-06-21 DIAGNOSIS — I161 Hypertensive emergency: Secondary | ICD-10-CM | POA: Diagnosis present

## 2017-06-21 DIAGNOSIS — R1310 Dysphagia, unspecified: Secondary | ICD-10-CM | POA: Diagnosis not present

## 2017-06-21 DIAGNOSIS — R402142 Coma scale, eyes open, spontaneous, at arrival to emergency department: Secondary | ICD-10-CM | POA: Diagnosis present

## 2017-06-21 DIAGNOSIS — I10 Essential (primary) hypertension: Secondary | ICD-10-CM | POA: Diagnosis present

## 2017-06-21 DIAGNOSIS — R000 Tachycardia, unspecified: Secondary | ICD-10-CM | POA: Diagnosis not present

## 2017-06-21 DIAGNOSIS — G8929 Other chronic pain: Secondary | ICD-10-CM | POA: Diagnosis not present

## 2017-06-21 DIAGNOSIS — Z6841 Body Mass Index (BMI) 40.0 and over, adult: Secondary | ICD-10-CM | POA: Diagnosis not present

## 2017-06-21 DIAGNOSIS — H538 Other visual disturbances: Secondary | ICD-10-CM | POA: Diagnosis not present

## 2017-06-21 DIAGNOSIS — I2510 Atherosclerotic heart disease of native coronary artery without angina pectoris: Secondary | ICD-10-CM | POA: Diagnosis present

## 2017-06-21 DIAGNOSIS — Z87891 Personal history of nicotine dependence: Secondary | ICD-10-CM | POA: Diagnosis not present

## 2017-06-21 DIAGNOSIS — R471 Dysarthria and anarthria: Secondary | ICD-10-CM | POA: Diagnosis present

## 2017-06-21 DIAGNOSIS — R51 Headache: Secondary | ICD-10-CM | POA: Diagnosis not present

## 2017-06-21 DIAGNOSIS — R29708 NIHSS score 8: Secondary | ICD-10-CM | POA: Diagnosis present

## 2017-06-21 DIAGNOSIS — R531 Weakness: Secondary | ICD-10-CM | POA: Diagnosis not present

## 2017-06-21 DIAGNOSIS — M545 Low back pain: Secondary | ICD-10-CM | POA: Diagnosis not present

## 2017-06-21 DIAGNOSIS — R402252 Coma scale, best verbal response, oriented, at arrival to emergency department: Secondary | ICD-10-CM | POA: Diagnosis present

## 2017-06-21 DIAGNOSIS — M797 Fibromyalgia: Secondary | ICD-10-CM | POA: Diagnosis present

## 2017-06-21 DIAGNOSIS — M6289 Other specified disorders of muscle: Secondary | ICD-10-CM | POA: Diagnosis not present

## 2017-06-21 DIAGNOSIS — I639 Cerebral infarction, unspecified: Secondary | ICD-10-CM | POA: Diagnosis present

## 2017-06-21 DIAGNOSIS — R402362 Coma scale, best motor response, obeys commands, at arrival to emergency department: Secondary | ICD-10-CM | POA: Diagnosis present

## 2017-06-21 DIAGNOSIS — R29810 Facial weakness: Secondary | ICD-10-CM | POA: Diagnosis present

## 2017-06-21 DIAGNOSIS — M6281 Muscle weakness (generalized): Secondary | ICD-10-CM | POA: Diagnosis not present

## 2017-06-22 ENCOUNTER — Other Ambulatory Visit: Payer: Self-pay

## 2017-06-24 DIAGNOSIS — I639 Cerebral infarction, unspecified: Secondary | ICD-10-CM | POA: Diagnosis not present

## 2017-06-24 DIAGNOSIS — I10 Essential (primary) hypertension: Secondary | ICD-10-CM | POA: Diagnosis present

## 2017-06-24 DIAGNOSIS — Z6834 Body mass index (BMI) 34.0-34.9, adult: Secondary | ICD-10-CM | POA: Diagnosis not present

## 2017-06-24 DIAGNOSIS — I69359 Hemiplegia and hemiparesis following cerebral infarction affecting unspecified side: Secondary | ICD-10-CM | POA: Diagnosis not present

## 2017-06-24 DIAGNOSIS — I69354 Hemiplegia and hemiparesis following cerebral infarction affecting left non-dominant side: Secondary | ICD-10-CM | POA: Diagnosis not present

## 2017-06-24 DIAGNOSIS — R600 Localized edema: Secondary | ICD-10-CM | POA: Diagnosis not present

## 2017-06-24 DIAGNOSIS — M7989 Other specified soft tissue disorders: Secondary | ICD-10-CM | POA: Diagnosis not present

## 2017-06-24 DIAGNOSIS — M797 Fibromyalgia: Secondary | ICD-10-CM | POA: Diagnosis present

## 2017-06-24 DIAGNOSIS — E669 Obesity, unspecified: Secondary | ICD-10-CM | POA: Insufficient documentation

## 2017-06-24 DIAGNOSIS — I69322 Dysarthria following cerebral infarction: Secondary | ICD-10-CM | POA: Diagnosis not present

## 2017-06-24 DIAGNOSIS — Z87891 Personal history of nicotine dependence: Secondary | ICD-10-CM | POA: Diagnosis not present

## 2017-06-24 MED ORDER — DULOXETINE HCL 30 MG PO CPEP
30.00 | ORAL_CAPSULE | ORAL | Status: DC
Start: 2017-06-25 — End: 2017-06-24

## 2017-06-24 MED ORDER — ASPIRIN 81 MG PO CHEW
81.00 | CHEWABLE_TABLET | ORAL | Status: DC
Start: 2017-06-25 — End: 2017-06-24

## 2017-06-24 MED ORDER — ENOXAPARIN SODIUM 40 MG/0.4ML ~~LOC~~ SOLN
40.00 | SUBCUTANEOUS | Status: DC
Start: 2017-06-24 — End: 2017-06-24

## 2017-06-24 MED ORDER — GENERIC EXTERNAL MEDICATION
Status: DC
Start: ? — End: 2017-06-24

## 2017-06-24 MED ORDER — ACETAMINOPHEN 325 MG PO TABS
650.00 | ORAL_TABLET | ORAL | Status: DC
Start: ? — End: 2017-06-24

## 2017-06-24 MED ORDER — ATORVASTATIN CALCIUM 80 MG PO TABS
80.00 | ORAL_TABLET | ORAL | Status: DC
Start: 2017-06-24 — End: 2017-06-24

## 2017-06-24 MED ORDER — GABAPENTIN 300 MG PO CAPS
300.00 | ORAL_CAPSULE | ORAL | Status: DC
Start: 2017-06-24 — End: 2017-06-24

## 2017-06-30 MED ORDER — GRX ANALGESIC BALM EX OINT
1.00 | TOPICAL_OINTMENT | CUTANEOUS | Status: DC
Start: 2017-06-30 — End: 2017-06-30

## 2017-06-30 MED ORDER — BISACODYL 10 MG RE SUPP
10.00 | RECTAL | Status: DC
Start: ? — End: 2017-06-30

## 2017-06-30 MED ORDER — OXYCODONE HCL 5 MG PO TABS
10.00 | ORAL_TABLET | ORAL | Status: DC
Start: ? — End: 2017-06-30

## 2017-06-30 MED ORDER — ENOXAPARIN SODIUM 40 MG/0.4ML ~~LOC~~ SOLN
40.00 | SUBCUTANEOUS | Status: DC
Start: 2017-06-30 — End: 2017-06-30

## 2017-06-30 MED ORDER — DOCUSATE SODIUM 100 MG PO CAPS
100.00 | ORAL_CAPSULE | ORAL | Status: DC
Start: ? — End: 2017-06-30

## 2017-06-30 MED ORDER — ACETAMINOPHEN 325 MG PO TABS
650.00 | ORAL_TABLET | ORAL | Status: DC
Start: ? — End: 2017-06-30

## 2017-06-30 MED ORDER — POLYETHYLENE GLYCOL 3350 17 G PO PACK
17.00 | PACK | ORAL | Status: DC
Start: ? — End: 2017-06-30

## 2017-06-30 MED ORDER — ASPIRIN 81 MG PO CHEW
81.00 | CHEWABLE_TABLET | ORAL | Status: DC
Start: 2017-07-01 — End: 2017-06-30

## 2017-06-30 MED ORDER — ATORVASTATIN CALCIUM 80 MG PO TABS
80.00 | ORAL_TABLET | ORAL | Status: DC
Start: 2017-06-30 — End: 2017-06-30

## 2017-06-30 MED ORDER — GABAPENTIN 300 MG PO CAPS
300.00 | ORAL_CAPSULE | ORAL | Status: DC
Start: 2017-06-30 — End: 2017-06-30

## 2017-06-30 MED ORDER — DULOXETINE HCL 30 MG PO CPEP
30.00 | ORAL_CAPSULE | ORAL | Status: DC
Start: 2017-07-01 — End: 2017-06-30

## 2017-06-30 MED ORDER — ONDANSETRON 4 MG PO TBDP
4.00 | ORAL_TABLET | ORAL | Status: DC
Start: ? — End: 2017-06-30

## 2017-06-30 MED ORDER — MELATONIN 3 MG PO TABS
3.00 | ORAL_TABLET | ORAL | Status: DC
Start: 2017-06-30 — End: 2017-06-30

## 2017-06-30 MED ORDER — GENERIC EXTERNAL MEDICATION
Status: DC
Start: ? — End: 2017-06-30

## 2017-06-30 MED ORDER — GENERIC EXTERNAL MEDICATION
2.00 | Status: DC
Start: 2017-06-30 — End: 2017-06-30

## 2017-07-10 DIAGNOSIS — Z9282 Status post administration of tPA (rtPA) in a different facility within the last 24 hours prior to admission to current facility: Secondary | ICD-10-CM | POA: Diagnosis not present

## 2017-07-10 DIAGNOSIS — R262 Difficulty in walking, not elsewhere classified: Secondary | ICD-10-CM | POA: Diagnosis not present

## 2017-07-10 DIAGNOSIS — Z9181 History of falling: Secondary | ICD-10-CM | POA: Diagnosis not present

## 2017-07-10 DIAGNOSIS — I639 Cerebral infarction, unspecified: Secondary | ICD-10-CM | POA: Diagnosis not present

## 2017-07-10 DIAGNOSIS — G8194 Hemiplegia, unspecified affecting left nondominant side: Secondary | ICD-10-CM | POA: Diagnosis not present

## 2017-07-14 ENCOUNTER — Encounter: Payer: Self-pay | Admitting: Nurse Practitioner

## 2017-07-14 ENCOUNTER — Ambulatory Visit (INDEPENDENT_AMBULATORY_CARE_PROVIDER_SITE_OTHER): Payer: Medicare Other | Admitting: Nurse Practitioner

## 2017-07-14 ENCOUNTER — Other Ambulatory Visit: Payer: Self-pay

## 2017-07-14 ENCOUNTER — Inpatient Hospital Stay: Payer: Medicare Other | Admitting: Nurse Practitioner

## 2017-07-14 VITALS — BP 109/87 | HR 113 | Temp 97.8°F | Ht 69.0 in | Wt 272.8 lb

## 2017-07-14 DIAGNOSIS — R931 Abnormal findings on diagnostic imaging of heart and coronary circulation: Secondary | ICD-10-CM

## 2017-07-14 DIAGNOSIS — E875 Hyperkalemia: Secondary | ICD-10-CM | POA: Diagnosis not present

## 2017-07-14 DIAGNOSIS — M6289 Other specified disorders of muscle: Secondary | ICD-10-CM | POA: Diagnosis not present

## 2017-07-14 DIAGNOSIS — M792 Neuralgia and neuritis, unspecified: Secondary | ICD-10-CM | POA: Diagnosis not present

## 2017-07-14 DIAGNOSIS — G8194 Hemiplegia, unspecified affecting left nondominant side: Secondary | ICD-10-CM | POA: Diagnosis not present

## 2017-07-14 DIAGNOSIS — R252 Cramp and spasm: Secondary | ICD-10-CM | POA: Diagnosis not present

## 2017-07-14 DIAGNOSIS — H5340 Unspecified visual field defects: Secondary | ICD-10-CM | POA: Diagnosis not present

## 2017-07-14 DIAGNOSIS — M797 Fibromyalgia: Secondary | ICD-10-CM | POA: Diagnosis not present

## 2017-07-14 DIAGNOSIS — I639 Cerebral infarction, unspecified: Secondary | ICD-10-CM | POA: Diagnosis not present

## 2017-07-14 DIAGNOSIS — I6930 Unspecified sequelae of cerebral infarction: Secondary | ICD-10-CM

## 2017-07-14 MED ORDER — GABAPENTIN 300 MG PO CAPS: 600.0000 mg | ORAL_CAPSULE | Freq: Three times a day (TID) | ORAL | 1 refills | Status: DC

## 2017-07-14 NOTE — Patient Instructions (Addendum)
Lance Carr,   Thank you for coming in to clinic today.  1. You can sign up for medical transport with [place] social services.  2. Increase gabapentin to 600 mg three times daily.  3. You will be due for FASTING BLOOD WORK.  This means you should eat no food or drink after midnight.  Drink only water or coffee without cream/sugar on the morning of your lab visit. - Please go ahead and schedule a "Lab Only" visit in the morning at the clinic for lab draw in the next 7 days. - Your results will be available about 2-3 days after blood draw.  If you have set up a MyChart account, you can can log in to MyChart online to view your results and a brief explanation. Also, we can discuss your results together at your next office visit if you would like.   Please schedule a follow-up appointment with Lance Carr, Lance Carr. Return in about 2 months (around 09/13/2017) for CVA.  If you have any other questions or concerns, please feel free to call the clinic or send a message through MyChart. You may also schedule an earlier appointment if necessary.  You will receive a survey after today's visit either digitally by e-mail or paper by [employer] mail. Your experiences and feedback matter to us.  Please respond so we know how we are doing as we provide care for you.   Lance McardleLauren Lamari Beckles, Lance Carr, Lance Carr Adult Gerontology Nurse Practitioner [facility], [facility]

## 2017-07-14 NOTE — Progress Notes (Signed)
Subjective:    Patient ID: Lance Carr, male    DOB: 12-30-1970, 47 y.o.   MRN: [id number]  Lance Carr is a 47 y.o. male presenting on 07/14/2017 for Hospitalization Follow-up (stroke x 3 weeks> SOB unsure if its related to the stroke )   HPI  HOSPITAL FOLLOW-UP VISIT  Hospital/Location: [facility] Date of Admission: 06/21/2017 Date of Discharge: 06/24/2017 Transitions of care telephone call:   Reason for Admission: dysarthria, left sided weakness Primary (+Secondary) Diagnosis: ischemic CVA  FOLLOW-UP  - Hospital H&P and Discharge Summary have been reviewed.   - Patient presents today 21 days after recent hospitalization.  - Brief summary of recent course: Pt presented with dysarthria and left sided weakness, initiall NIHSS = 9; discharge NIHSS=4.  Pt received alteplase.  - Increased risk for stroke w/ HTN, Family hx CVA. - Today reports overall has done fairly well after discharge without any new symptoms of stroke.  He continues to have fatigue, shortness of breath, vision changes of Left eye (blurriness, peripheral field loss), neuropathic pain of left arm, left leg cramps, headaches, photophobia, phonophobia.  Left arm and leg weakness persist.  Has completed evaluation for PT and OT is scheduled for Monday.  He will receive therapy for up to 6 weeks.  - New medications on discharge: atorvastatin 80 mg once daily, aspirin 81 mg once daily, acetaminophen 650 q4hr prn, cyclobenzaprine, and oxycodone 10 mg - Changes to current meds on discharge: gabapentin 300 mg tid  Care Coordination: - [facility] in [facility] - No appointment is scheduled - PT/OT referrals completed and ongoing care - Pt and wife report he will need additional referrals to Cardiology for evaluation of apical cardiac wall motion abnormality on echo (likely CAD) and neuro-ophthalmology as he was 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  They have not had any contact or notification about these referrals to date.  Social History   Tobacco Use  . Smoking status: Former Smoker    Years: 20.00    Last attempt to quit: 03/01/2005    Years since quitting: 12.3  . Smokeless tobacco: Never Used  Substance Use Topics  . Alcohol use: No    Frequency: Never  . Drug use: No    Review of Systems  Constitutional: Negative.   HENT: Negative.   Eyes: Positive for visual disturbance.  Respiratory: Positive for shortness of breath.   Cardiovascular: Negative.   Gastrointestinal: Negative.   Endocrine: Negative.   Genitourinary: Negative.   Musculoskeletal: Positive for myalgias.  Skin: Negative.   Allergic/Immunologic: Negative.   Neurological: Positive for weakness and headaches.  Psychiatric/Behavioral: Positive for dysphoric mood and sleep disturbance. Negative for suicidal ideas.   Per HPI unless specifically indicated above   Outpatient Encounter Medications as of 07/14/2017  Medication Sig  . acetaminophen (TYLENOL) 325 MG tablet Take by mouth.  Marland Kitchen. aspirin 81 MG chewable tablet Chew by mouth.  Marland Kitchen. atorvastatin (LIPITOR) 80 MG tablet Take by mouth.  . Cholecalciferol (VITAMIN D3) 5000 units CAPS Take 1 capsule (5,000 Units total) by mouth daily with breakfast. Take along with calcium and magnesium.  . cyclobenzaprine (FLEXERIL) 10 MG tablet Take 10 mg by mouth 3 (three) times daily as needed for muscle spasms.  . DULoxetine (CYMBALTA) 30 MG capsule Take 30 mg by mouth daily.  Marland Kitchen. gabapentin (NEURONTIN) 300 MG capsule Take 2 capsules (600 mg  total) by mouth every 8 (eight) hours.  Marland Kitchen ibuprofen (ADVIL,MOTRIN) 200 MG tablet Take 600 mg by mouth every 6 (six) hours as needed for moderate pain.  . Melatonin 3 MG TABS Take by mouth.  . Menthol-Methyl Salicylate (GRX ANALGESIC BALM) OINT Apply topically.  . Oxycodone HCl 10 MG TABS   . Vitamin D, Ergocalciferol, (DRISDOL) 50000 units CAPS capsule Take by  mouth.  . [DISCONTINUED] gabapentin (NEURONTIN) 300 MG capsule Take 1 capsule (300 mg total) by mouth every 8 (eight) hours.  . Calcium Carbonate-Vit D-Min (GNP CALCIUM 1200) 1200-1000 MG-UNIT CHEW Chew 1,200 mg by mouth daily with breakfast. Take in combination with vitamin D and magnesium. (Patient not taking: Reported on 07/14/2017)  . Magnesium 500 MG CAPS Take 1 capsule (500 mg total) by mouth 2 (two) times daily at 8 am and 10 pm.  . [DISCONTINUED] fenoprofen (NALFON) 600 MG TABS tablet Take 600 mg by mouth 3 (three) times daily.  . [DISCONTINUED] Magnesium Oxide, Antacid, 500 MG CAPS Take by mouth.   No facility-administered encounter medications on file as of 07/14/2017.       Objective:    BP 109/87 (BP Location: Right Arm, Patient Position: Sitting, Cuff Size: Large)   Pulse (!) 113   Temp 97.8 F (36.6 C) (Oral)   Ht 5\' 9"  (1.753 m)   Wt 272 lb 12.8 oz (123.7 kg)   SpO2 99%   BMI 40.29 kg/m   Wt Readings from Last 3 Encounters:  07/14/17 272 lb 12.8 oz (123.7 kg)  04/26/17 270 lb (122.5 kg)  03/08/17 290 lb (131.5 kg)    Physical Exam  Constitutional: He is oriented to person, place, and time. He appears well-developed and well-nourished. No distress.  HENT:  Head: Normocephalic and atraumatic.  Cardiovascular: Normal rate, regular rhythm, S1 normal, S2 normal, normal heart sounds and intact distal pulses.  Pulmonary/Chest: Effort normal and breath sounds normal. No respiratory distress.  Neurological: He is alert and oriented to person, place, and time. He has normal reflexes. He displays no tremor. A cranial nerve deficit is present. No sensory deficit. He exhibits normal muscle tone. Gait (antalgic gait, walking with cane, L leg weakness) abnormal.  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  Psychiatric: He has a normal mood and affect. His behavior is normal. Judgment and thought content normal.  Vitals reviewed.   Results for orders placed or performed during the hospital encounter of 03/08/17  VITAMIN D 25 Hydroxy (Vit-D Deficiency, Fractures)  Result Value Ref Range   Vit D, 25-Hydroxy 22.4 (L) 30.0 - 100.0 ng/mL  Sedimentation rate  Result Value Ref Range   Sed Rate 8 0 - 15 mm/hr  Magnesium  Result Value Ref Range   Magnesium 2.1 1.7 - 2.4 mg/dL  Comprehensive metabolic panel  Result Value Ref Range   Sodium 137 135 - 145 mmol/L   Potassium 3.8 3.5 - 5.1 mmol/L   Chloride 104 101 - 111 mmol/L   CO2 26 22 - 32 mmol/L   Glucose, Bld 98 65 - 99 mg/dL   BUN 12 6 -  20 mg/dL   Creatinine, Ser 0.27 0.61 - 1.24 mg/dL   Calcium 8.9 8.9 - 25.3 mg/dL   Total Protein 7.7 6.5 - 8.1 g/dL   Albumin 4.3 3.5 - 5.0 g/dL   AST 20 15 - 41 U/L   ALT 15 (L) 17 - 63 U/L   Alkaline Phosphatase 52 38 - 126 U/L   Total Bilirubin 0.9 0.3 - 1.2 mg/dL   GFR calc non Af Amer >60 >60 mL/min   GFR calc Af Amer >60 >60 mL/min   Anion gap 7 5 - 15      Assessment & Plan:   Problem List Items Addressed This Visit      Other   Neurogenic pain (Chronic)   Relevant Medications   gabapentin (NEURONTIN) 300 MG capsule    Other Visit Diagnoses    History of cerebrovascular accident (CVA) with residual deficit    -  Primary   Relevant Medications   gabapentin (NEURONTIN) 300 MG capsule   Other Relevant Orders   Ambulatory referral to Cardiology   Ambulatory referral to Ophthalmology   Abnormal echocardiogram       Relevant Orders   Ambulatory referral to Cardiology   Visual field loss       Relevant Orders   Ambulatory referral to Ophthalmology   Hyperkalemia        Relevant Orders   Basic Metabolic Panel (BMET)   Muscle cramp       Relevant Orders   Basic Metabolic Panel (BMET)   Magnesium      Pt with hospital followup after time spent in rehabilitation center.  Now 21 days post hospital discharge and no significant post-hospital followup has been completed.  Pt has not had any specialist referral followup and requests several referrals to address cardiac wall abnormalities and visual field loss.  Small improvements in strength and sensation are occurring, but pain is increasing.  Plan: 1. Continue PT/OT as scheduled. 2. Referral to ophthalmology 3. Referral to cardiology - consider stress test vs cardiac cath 4. Instructed pt to call [facility] for followup appointment ASAP.  This was requested on discharge. 5. Labs in clinic early next week for BMP, magnesium.  Pt with hyperkalemia inpatient and new leg cramps.  Reevaluation needed. 6. Increase gabapentin to 600 mg tid for increased nerve pain 7. Followup 2 months or sooner if needed.  Meds ordered this encounter  Medications  . gabapentin (NEURONTIN) 300 MG capsule    Sig: Take 2 capsules (600 mg total) by mouth every 8 (eight) hours.    Dispense:  90 capsule    Refill:  1    Order Specific Question:   Supervising Provider    Answer:   Smitty Cords [[id number]]   I have reviewed the admission H&P, hospital notes, discharge summary, discharge medication list, and have reconciled the current and discharge medications today.  A total of 40 minutes was spent face-to-face with this patient. Greater than 50% of this time was spent in counseling and coordination of care with the patient. 35/40 minutes spent discussing CVA recovery, expected symptoms, coordinating medications, coordinating referrals/tests completed and not yet completed, recovery support/transportation/home assistance.  Follow up plan: Return in about 2 months (around 09/13/2017) for CVA.  Wilhelmina Mcardle, DNP,  AGPCNP-BC Adult Gerontology Primary Care Nurse Practitioner [facility] 07/14/2017, 6:04 PM

## 2017-07-17 DIAGNOSIS — R262 Difficulty in walking, not elsewhere classified: Secondary | ICD-10-CM | POA: Diagnosis not present

## 2017-07-17 DIAGNOSIS — Z9181 History of falling: Secondary | ICD-10-CM | POA: Diagnosis not present

## 2017-07-17 DIAGNOSIS — I639 Cerebral infarction, unspecified: Secondary | ICD-10-CM | POA: Diagnosis not present

## 2017-07-17 DIAGNOSIS — Z9282 Status post administration of tPA (rtPA) in a different facility within the last 24 hours prior to admission to current facility: Secondary | ICD-10-CM | POA: Diagnosis not present

## 2017-07-17 DIAGNOSIS — G8194 Hemiplegia, unspecified affecting left nondominant side: Secondary | ICD-10-CM | POA: Diagnosis not present

## 2017-07-19 DIAGNOSIS — I639 Cerebral infarction, unspecified: Secondary | ICD-10-CM | POA: Diagnosis not present

## 2017-07-19 DIAGNOSIS — R600 Localized edema: Secondary | ICD-10-CM | POA: Diagnosis not present

## 2017-07-19 DIAGNOSIS — M79605 Pain in left leg: Secondary | ICD-10-CM | POA: Diagnosis not present

## 2017-07-24 ENCOUNTER — Telehealth: Payer: Self-pay | Admitting: Nurse Practitioner

## 2017-07-24 DIAGNOSIS — Z87891 Personal history of nicotine dependence: Secondary | ICD-10-CM | POA: Diagnosis not present

## 2017-07-24 DIAGNOSIS — I10 Essential (primary) hypertension: Secondary | ICD-10-CM | POA: Diagnosis not present

## 2017-07-24 DIAGNOSIS — Z9884 Bariatric surgery status: Secondary | ICD-10-CM | POA: Diagnosis not present

## 2017-07-24 DIAGNOSIS — R262 Difficulty in walking, not elsewhere classified: Secondary | ICD-10-CM | POA: Diagnosis not present

## 2017-07-24 DIAGNOSIS — I639 Cerebral infarction, unspecified: Secondary | ICD-10-CM | POA: Diagnosis not present

## 2017-07-24 DIAGNOSIS — E669 Obesity, unspecified: Secondary | ICD-10-CM | POA: Diagnosis not present

## 2017-07-24 DIAGNOSIS — G8929 Other chronic pain: Secondary | ICD-10-CM | POA: Diagnosis not present

## 2017-07-24 DIAGNOSIS — R079 Chest pain, unspecified: Secondary | ICD-10-CM | POA: Diagnosis not present

## 2017-07-24 DIAGNOSIS — Z9282 Status post administration of tPA (rtPA) in a different facility within the last 24 hours prior to admission to current facility: Secondary | ICD-10-CM | POA: Diagnosis not present

## 2017-07-24 DIAGNOSIS — M549 Dorsalgia, unspecified: Secondary | ICD-10-CM | POA: Diagnosis not present

## 2017-07-24 DIAGNOSIS — Z9181 History of falling: Secondary | ICD-10-CM | POA: Diagnosis not present

## 2017-07-24 DIAGNOSIS — Z7982 Long term (current) use of aspirin: Secondary | ICD-10-CM | POA: Diagnosis not present

## 2017-07-24 DIAGNOSIS — I69334 Monoplegia of upper limb following cerebral infarction affecting left non-dominant side: Secondary | ICD-10-CM | POA: Diagnosis not present

## 2017-07-24 DIAGNOSIS — Z79899 Other long term (current) drug therapy: Secondary | ICD-10-CM | POA: Diagnosis not present

## 2017-07-24 DIAGNOSIS — R0789 Other chest pain: Secondary | ICD-10-CM | POA: Diagnosis not present

## 2017-07-24 DIAGNOSIS — I69344 Monoplegia of lower limb following cerebral infarction affecting left non-dominant side: Secondary | ICD-10-CM | POA: Diagnosis not present

## 2017-07-24 DIAGNOSIS — G8194 Hemiplegia, unspecified affecting left nondominant side: Secondary | ICD-10-CM | POA: Diagnosis not present

## 2017-07-24 NOTE — Telephone Encounter (Signed)
The pt was notified that he needs to go straight to the ER. Pt complaining of intermittent chest discomfort. He admits that he's had chest pain before, but normally if he takes a deep breath the pain resolve. Today his pain is in the center of his chest and it's been going for a few days. I spoke w/ Leotis ShamesLauren and she verbalize the patient needs to go to the ER right away. The pt verbalize understanding.

## 2017-07-24 NOTE — Telephone Encounter (Signed)
Before OT pt's blood pressure was 135/85 and after a little OT his BP was 144/92.  He has also been complaining of chest pressure.  Please call pt's wife Francesco RunnerLeyda [phone]

## 2017-07-24 NOTE — Telephone Encounter (Signed)
No additional action required at this time. Patient to receive care today [facility] and written documentation.

## 2017-07-26 ENCOUNTER — Telehealth: Payer: Self-pay

## 2017-07-26 DIAGNOSIS — M7989 Other specified soft tissue disorders: Secondary | ICD-10-CM | POA: Diagnosis not present

## 2017-07-26 DIAGNOSIS — R29810 Facial weakness: Secondary | ICD-10-CM | POA: Diagnosis not present

## 2017-07-26 DIAGNOSIS — R531 Weakness: Secondary | ICD-10-CM | POA: Diagnosis not present

## 2017-07-26 DIAGNOSIS — G8194 Hemiplegia, unspecified affecting left nondominant side: Secondary | ICD-10-CM | POA: Diagnosis not present

## 2017-07-26 DIAGNOSIS — Z86718 Personal history of other venous thrombosis and embolism: Secondary | ICD-10-CM | POA: Diagnosis not present

## 2017-07-26 DIAGNOSIS — Z9282 Status post administration of tPA (rtPA) in a different facility within the last 24 hours prior to admission to current facility: Secondary | ICD-10-CM | POA: Diagnosis not present

## 2017-07-26 DIAGNOSIS — I639 Cerebral infarction, unspecified: Secondary | ICD-10-CM | POA: Diagnosis not present

## 2017-07-26 DIAGNOSIS — R0602 Shortness of breath: Secondary | ICD-10-CM | POA: Diagnosis not present

## 2017-07-26 DIAGNOSIS — R200 Anesthesia of skin: Secondary | ICD-10-CM | POA: Diagnosis not present

## 2017-07-26 DIAGNOSIS — G43909 Migraine, unspecified, not intractable, without status migrainosus: Secondary | ICD-10-CM | POA: Diagnosis not present

## 2017-07-26 DIAGNOSIS — Z87891 Personal history of nicotine dependence: Secondary | ICD-10-CM | POA: Diagnosis not present

## 2017-07-26 DIAGNOSIS — Z9884 Bariatric surgery status: Secondary | ICD-10-CM | POA: Diagnosis not present

## 2017-07-26 DIAGNOSIS — R0781 Pleurodynia: Secondary | ICD-10-CM | POA: Diagnosis not present

## 2017-07-26 DIAGNOSIS — Z8673 Personal history of transient ischemic attack (TIA), and cerebral infarction without residual deficits: Secondary | ICD-10-CM | POA: Diagnosis not present

## 2017-07-26 DIAGNOSIS — M797 Fibromyalgia: Secondary | ICD-10-CM | POA: Diagnosis not present

## 2017-07-26 DIAGNOSIS — Z9181 History of falling: Secondary | ICD-10-CM | POA: Diagnosis not present

## 2017-07-26 DIAGNOSIS — R000 Tachycardia, unspecified: Secondary | ICD-10-CM | POA: Diagnosis not present

## 2017-07-26 DIAGNOSIS — Z7982 Long term (current) use of aspirin: Secondary | ICD-10-CM | POA: Diagnosis not present

## 2017-07-26 DIAGNOSIS — R262 Difficulty in walking, not elsewhere classified: Secondary | ICD-10-CM | POA: Diagnosis not present

## 2017-07-26 DIAGNOSIS — I10 Essential (primary) hypertension: Secondary | ICD-10-CM | POA: Diagnosis not present

## 2017-07-26 DIAGNOSIS — R5383 Other fatigue: Secondary | ICD-10-CM | POA: Diagnosis not present

## 2017-07-26 DIAGNOSIS — R51 Headache: Secondary | ICD-10-CM | POA: Diagnosis not present

## 2017-07-26 DIAGNOSIS — Z79899 Other long term (current) drug therapy: Secondary | ICD-10-CM | POA: Diagnosis not present

## 2017-07-26 DIAGNOSIS — R911 Solitary pulmonary nodule: Secondary | ICD-10-CM | POA: Diagnosis not present

## 2017-07-26 NOTE — Telephone Encounter (Signed)
The pt wife call with concerns about the pt blood pressure increase during Physical Therapy today. She states the pt blood pressure was 147/96 and after walking a very short distance it increased to 157/101. They stop the physical therapy because of bp concerns and told her to f/u with his PCP.  Since physical therapy the pt is now complaining of numbness in the left leg with redness. I informed the patient that he needs to go to the ER. His wife stated that [facility] are [facility] and feels more comfortable going since they know his medical condition. I verbalize understanding and stress the importants getting there ASAP. She stated they was headed there now and just wanted to notify us of what was going on.

## 2017-07-26 NOTE — Telephone Encounter (Signed)
Call placed to Alexander MtSusan E. Wilson RN, MSN, C-ANP  Adult Stroke Nurse Practitioner  [facility]  [phone]  Reviewed recent symptoms reported via patient over phone to our clinic and requested info about Neuro referral.  -  Scheduled neurology appointment is 09/2017 for soonest available.  Darl PikesSusan is going to place him on call list for cancellations if sooner appointment arises.  In review of additional chart info, patient had similar symptoms with rehab in past, doppler studies were ordered and performed and were negative.      After additional info: There is possible association to chronic pain/lumbar injury, cardiac origin, or neurology origin.  Increase in BP with leg pain and numbness after activity may be related to lumbar injury and chronic pain.  Regardless of cause, BP rise with activity is increasing CVA risk.  Agree with OT and Tedd SiasKelita, CMA recommendation to return to ED today.

## 2017-08-01 DIAGNOSIS — I639 Cerebral infarction, unspecified: Secondary | ICD-10-CM | POA: Diagnosis not present

## 2017-08-01 DIAGNOSIS — Z9282 Status post administration of tPA (rtPA) in a different facility within the last 24 hours prior to admission to current facility: Secondary | ICD-10-CM | POA: Diagnosis not present

## 2017-08-01 DIAGNOSIS — G894 Chronic pain syndrome: Secondary | ICD-10-CM | POA: Diagnosis not present

## 2017-08-02 DIAGNOSIS — G8194 Hemiplegia, unspecified affecting left nondominant side: Secondary | ICD-10-CM | POA: Diagnosis not present

## 2017-08-02 DIAGNOSIS — I639 Cerebral infarction, unspecified: Secondary | ICD-10-CM | POA: Diagnosis not present

## 2017-08-02 DIAGNOSIS — Z9181 History of falling: Secondary | ICD-10-CM | POA: Diagnosis not present

## 2017-08-02 DIAGNOSIS — Z9282 Status post administration of tPA (rtPA) in a different facility within the last 24 hours prior to admission to current facility: Secondary | ICD-10-CM | POA: Diagnosis not present

## 2017-08-02 DIAGNOSIS — R262 Difficulty in walking, not elsewhere classified: Secondary | ICD-10-CM | POA: Diagnosis not present

## 2017-08-03 DIAGNOSIS — I639 Cerebral infarction, unspecified: Secondary | ICD-10-CM | POA: Diagnosis not present

## 2017-08-04 ENCOUNTER — Other Ambulatory Visit: Payer: Self-pay

## 2017-08-04 DIAGNOSIS — E162 Hypoglycemia, unspecified: Secondary | ICD-10-CM

## 2017-08-04 MED ORDER — BD LANCET ULTRAFINE 30G MISC: 1 refills | Status: DC

## 2017-08-04 MED ORDER — TRUE METRIX METER W/DEVICE KIT: PACK | 0 refills | Status: DC

## 2017-08-04 MED ORDER — TRUE METRIX BLOOD GLUCOSE TEST VI STRP: ORAL_STRIP | 1 refills | Status: DC

## 2017-08-04 NOTE — Telephone Encounter (Signed)
Orders placed and signed.  Saralyn PilarAlexander Karamalegos, DO [facility] 08/04/2017, 5:06 PM

## 2017-08-04 NOTE — Telephone Encounter (Signed)
The pt called requesting a refill of the pt true metrix glucometer test strips and lancets. She states he check his blood sugar PRN when he feel like his blood sugars dropped. The pt wife states he been diagnose with hypoglycemic.

## 2017-08-07 ENCOUNTER — Other Ambulatory Visit: Payer: Self-pay | Admitting: Nurse Practitioner

## 2017-08-07 DIAGNOSIS — E669 Obesity, unspecified: Secondary | ICD-10-CM | POA: Diagnosis not present

## 2017-08-07 DIAGNOSIS — M5441 Lumbago with sciatica, right side: Principal | ICD-10-CM

## 2017-08-07 DIAGNOSIS — I10 Essential (primary) hypertension: Secondary | ICD-10-CM | POA: Diagnosis not present

## 2017-08-07 DIAGNOSIS — G43909 Migraine, unspecified, not intractable, without status migrainosus: Secondary | ICD-10-CM | POA: Diagnosis not present

## 2017-08-07 DIAGNOSIS — Z86718 Personal history of other venous thrombosis and embolism: Secondary | ICD-10-CM | POA: Insufficient documentation

## 2017-08-07 DIAGNOSIS — Z9282 Status post administration of tPA (rtPA) in a different facility within the last 24 hours prior to admission to current facility: Secondary | ICD-10-CM | POA: Diagnosis not present

## 2017-08-07 DIAGNOSIS — R0602 Shortness of breath: Secondary | ICD-10-CM | POA: Diagnosis not present

## 2017-08-07 DIAGNOSIS — R51 Headache: Secondary | ICD-10-CM | POA: Diagnosis not present

## 2017-08-07 DIAGNOSIS — R079 Chest pain, unspecified: Secondary | ICD-10-CM | POA: Diagnosis not present

## 2017-08-07 DIAGNOSIS — G8194 Hemiplegia, unspecified affecting left nondominant side: Secondary | ICD-10-CM | POA: Diagnosis not present

## 2017-08-07 DIAGNOSIS — Z7982 Long term (current) use of aspirin: Secondary | ICD-10-CM | POA: Diagnosis not present

## 2017-08-07 DIAGNOSIS — R000 Tachycardia, unspecified: Secondary | ICD-10-CM | POA: Diagnosis not present

## 2017-08-07 DIAGNOSIS — R0789 Other chest pain: Secondary | ICD-10-CM | POA: Diagnosis not present

## 2017-08-07 DIAGNOSIS — Z9884 Bariatric surgery status: Secondary | ICD-10-CM | POA: Diagnosis not present

## 2017-08-07 DIAGNOSIS — Z9181 History of falling: Secondary | ICD-10-CM | POA: Diagnosis not present

## 2017-08-07 DIAGNOSIS — Z6839 Body mass index (BMI) 39.0-39.9, adult: Secondary | ICD-10-CM | POA: Diagnosis not present

## 2017-08-07 DIAGNOSIS — Z8673 Personal history of transient ischemic attack (TIA), and cerebral infarction without residual deficits: Secondary | ICD-10-CM | POA: Insufficient documentation

## 2017-08-07 DIAGNOSIS — M5442 Lumbago with sciatica, left side: Principal | ICD-10-CM

## 2017-08-07 DIAGNOSIS — I69354 Hemiplegia and hemiparesis following cerebral infarction affecting left non-dominant side: Secondary | ICD-10-CM | POA: Diagnosis not present

## 2017-08-07 DIAGNOSIS — I639 Cerebral infarction, unspecified: Secondary | ICD-10-CM | POA: Diagnosis not present

## 2017-08-07 DIAGNOSIS — Z87891 Personal history of nicotine dependence: Secondary | ICD-10-CM | POA: Diagnosis not present

## 2017-08-07 DIAGNOSIS — F419 Anxiety disorder, unspecified: Secondary | ICD-10-CM | POA: Diagnosis not present

## 2017-08-07 DIAGNOSIS — M797 Fibromyalgia: Secondary | ICD-10-CM | POA: Diagnosis not present

## 2017-08-07 DIAGNOSIS — R0600 Dyspnea, unspecified: Secondary | ICD-10-CM | POA: Diagnosis not present

## 2017-08-07 DIAGNOSIS — E785 Hyperlipidemia, unspecified: Secondary | ICD-10-CM | POA: Diagnosis not present

## 2017-08-07 DIAGNOSIS — R262 Difficulty in walking, not elsewhere classified: Secondary | ICD-10-CM | POA: Diagnosis not present

## 2017-08-07 DIAGNOSIS — G8929 Other chronic pain: Secondary | ICD-10-CM

## 2017-08-08 ENCOUNTER — Other Ambulatory Visit: Payer: Self-pay

## 2017-08-08 DIAGNOSIS — R0789 Other chest pain: Secondary | ICD-10-CM | POA: Diagnosis not present

## 2017-08-08 DIAGNOSIS — Z6830 Body mass index (BMI) 30.0-30.9, adult: Secondary | ICD-10-CM | POA: Diagnosis not present

## 2017-08-08 DIAGNOSIS — I639 Cerebral infarction, unspecified: Secondary | ICD-10-CM | POA: Diagnosis not present

## 2017-08-08 DIAGNOSIS — I209 Angina pectoris, unspecified: Secondary | ICD-10-CM | POA: Diagnosis not present

## 2017-08-08 DIAGNOSIS — E785 Hyperlipidemia, unspecified: Secondary | ICD-10-CM | POA: Diagnosis not present

## 2017-08-08 DIAGNOSIS — R079 Chest pain, unspecified: Secondary | ICD-10-CM | POA: Diagnosis not present

## 2017-08-08 DIAGNOSIS — I10 Essential (primary) hypertension: Secondary | ICD-10-CM | POA: Diagnosis not present

## 2017-08-08 DIAGNOSIS — Z8673 Personal history of transient ischemic attack (TIA), and cerebral infarction without residual deficits: Secondary | ICD-10-CM | POA: Diagnosis not present

## 2017-08-08 DIAGNOSIS — E669 Obesity, unspecified: Secondary | ICD-10-CM | POA: Diagnosis not present

## 2017-08-08 DIAGNOSIS — Z86718 Personal history of other venous thrombosis and embolism: Secondary | ICD-10-CM | POA: Diagnosis not present

## 2017-08-08 MED ORDER — MELATONIN 3 MG PO TABS
9.00 | ORAL_TABLET | ORAL | Status: DC
Start: ? — End: 2017-08-08

## 2017-08-08 MED ORDER — GABAPENTIN 300 MG PO CAPS
600.00 | ORAL_CAPSULE | ORAL | Status: DC
Start: 2017-08-08 — End: 2017-08-08

## 2017-08-08 MED ORDER — GENERIC EXTERNAL MEDICATION
2.00 | Status: DC
Start: 2017-08-08 — End: 2017-08-08

## 2017-08-08 MED ORDER — IBUPROFEN 600 MG PO TABS
600.00 | ORAL_TABLET | ORAL | Status: DC
Start: ? — End: 2017-08-08

## 2017-08-08 MED ORDER — DULOXETINE HCL 30 MG PO CPEP
30.00 | ORAL_CAPSULE | ORAL | Status: DC
Start: 2017-08-09 — End: 2017-08-08

## 2017-08-08 MED ORDER — ATORVASTATIN CALCIUM 80 MG PO TABS
80.00 | ORAL_TABLET | ORAL | Status: DC
Start: 2017-08-09 — End: 2017-08-08

## 2017-08-08 MED ORDER — NITROGLYCERIN 0.4 MG SL SUBL
0.40 | SUBLINGUAL_TABLET | SUBLINGUAL | Status: DC
Start: ? — End: 2017-08-08

## 2017-08-08 MED ORDER — CYCLOBENZAPRINE HCL 10 MG PO TABS
10.00 | ORAL_TABLET | ORAL | Status: DC
Start: ? — End: 2017-08-08

## 2017-08-08 MED ORDER — LIDOCAINE 5 % EX PTCH
1.00 | MEDICATED_PATCH | CUTANEOUS | Status: DC
Start: 2017-08-09 — End: 2017-08-08

## 2017-08-08 MED ORDER — MAGNESIUM OXIDE 400 MG PO TABS
400.00 | ORAL_TABLET | ORAL | Status: DC
Start: 2017-08-09 — End: 2017-08-08

## 2017-08-08 MED ORDER — ASPIRIN 81 MG PO CHEW
81.00 | CHEWABLE_TABLET | ORAL | Status: DC
Start: 2017-08-09 — End: 2017-08-08

## 2017-08-08 MED ORDER — ACETAMINOPHEN 500 MG PO TABS
1000.00 | ORAL_TABLET | ORAL | Status: DC
Start: ? — End: 2017-08-08

## 2017-08-09 ENCOUNTER — Telehealth: Payer: Self-pay | Admitting: Nurse Practitioner

## 2017-08-09 DIAGNOSIS — E785 Hyperlipidemia, unspecified: Secondary | ICD-10-CM

## 2017-08-09 DIAGNOSIS — I6930 Unspecified sequelae of cerebral infarction: Secondary | ICD-10-CM

## 2017-08-09 DIAGNOSIS — I2510 Atherosclerotic heart disease of native coronary artery without angina pectoris: Secondary | ICD-10-CM

## 2017-08-09 MED ORDER — ATORVASTATIN CALCIUM 80 MG PO TABS: 80.0000 mg | ORAL_TABLET | Freq: Every day | ORAL | 3 refills | Status: DC

## 2017-08-09 NOTE — Telephone Encounter (Signed)
Pt was given atorvastatin calcium 80 mg [facility]  [facility] a [facility]  His call back number is [phone]

## 2017-08-11 DIAGNOSIS — I10 Essential (primary) hypertension: Secondary | ICD-10-CM | POA: Diagnosis not present

## 2017-08-11 DIAGNOSIS — Z8673 Personal history of transient ischemic attack (TIA), and cerebral infarction without residual deficits: Secondary | ICD-10-CM | POA: Diagnosis not present

## 2017-08-11 DIAGNOSIS — E782 Mixed hyperlipidemia: Secondary | ICD-10-CM | POA: Diagnosis not present

## 2017-08-11 DIAGNOSIS — R931 Abnormal findings on diagnostic imaging of heart and coronary circulation: Secondary | ICD-10-CM | POA: Diagnosis not present

## 2017-08-11 DIAGNOSIS — Z86718 Personal history of other venous thrombosis and embolism: Secondary | ICD-10-CM | POA: Diagnosis not present

## 2017-08-11 DIAGNOSIS — M797 Fibromyalgia: Secondary | ICD-10-CM | POA: Diagnosis not present

## 2017-08-14 ENCOUNTER — Ambulatory Visit (INDEPENDENT_AMBULATORY_CARE_PROVIDER_SITE_OTHER): Payer: Medicare Other | Admitting: Nurse Practitioner

## 2017-08-14 ENCOUNTER — Other Ambulatory Visit: Payer: Self-pay

## 2017-08-14 ENCOUNTER — Encounter: Payer: Self-pay | Admitting: Nurse Practitioner

## 2017-08-14 VITALS — BP 130/94 | HR 85 | Ht 69.0 in | Wt 278.0 lb

## 2017-08-14 DIAGNOSIS — I158 Other secondary hypertension: Secondary | ICD-10-CM | POA: Diagnosis not present

## 2017-08-14 DIAGNOSIS — Z09 Encounter for follow-up examination after completed treatment for conditions other than malignant neoplasm: Secondary | ICD-10-CM

## 2017-08-14 DIAGNOSIS — G43419 Hemiplegic migraine, intractable, without status migrainosus: Secondary | ICD-10-CM | POA: Diagnosis not present

## 2017-08-14 DIAGNOSIS — R0789 Other chest pain: Secondary | ICD-10-CM | POA: Diagnosis not present

## 2017-08-14 DIAGNOSIS — F4323 Adjustment disorder with mixed anxiety and depressed mood: Secondary | ICD-10-CM | POA: Diagnosis not present

## 2017-08-14 MED ORDER — PROPRANOLOL HCL ER 80 MG PO CP24
80.0000 mg | ORAL_CAPSULE | Freq: Every day | ORAL | 5 refills | Status: DC
Start: 2017-08-14 — End: 2017-10-02

## 2017-08-14 NOTE — Progress Notes (Signed)
Subjective:    Patient ID: Lance Carr, male    DOB: 10-06-1970, 47 y.o.   MRN: [id number]  Talbot Monarch is a 47 y.o. male presenting on 08/14/2017 for Hospitalization Follow-up (pt was seen by Cardiologist on Friday,  July 26th. Nuerologist appt scheduled for August 8th) and Chest Pain (unsure if it could be muscle skeletal)   HPI HOSPITAL FOLLOW-UP VISIT  Hospital/Location: [facility] ED Date of Admission: 08/07/2017 Date of Discharge: 08/08/2017 Transitions of care telephone call: n/a  Reason for Admission: Chest pain Primary (+Secondary) Diagnosis: Chest pain (obesity, hypertension, history CVA)  [facility] H&P and Discharge Summary have been reviewed - Patient presents today 7 days after recent hospitalization. Brief summary of recent course, patient had symptoms of chest pain intermittently with physical exertion at PT/OT and at home at rest.  He was seen in ED 2x prior to this visit for similar symptoms.  He was admitted for observation and cardiac workup, which was negative for serial troponin, PET stress test, Echo in June with wall motion abnormalities.  It is determined that pain is largely musculoskeletal by [facility] - New medications on discharge: None - Changes to current meds on discharge: None - awaiting lidocaine patch from pain management prescription.  - Today reports overall has done well after discharge. Symptoms of chest pain are stable without improvement.   He reports symptoms are still present with exertion, but is also present at rest.   He notes significant difficulty with accepting help from others during this time and is sleeping to avoid this.  He also often stares out into space in deep thought according to his wife Clint Bolder who accompanies patient today.  Often, patient states these thoughts are about changes in his lifestyle, current rehab from CVA.  BP at home during symptoms and with exertion is usually 135/90-95, at rest is often 110-115/80.  - Had fatigue,  shortness of breath after straining to have BM yesterday.  Usually is not required to strain. - Continues to cancel PT/OT appointments until today's visit and neurology followup for fear of recurrent symptoms.   Social History   Tobacco Use  . Smoking status: Former Smoker    Years: 20.00    Last attempt to quit: 03/01/2005    Years since quitting: 12.4  . Smokeless tobacco: Never Used  Substance Use Topics  . Alcohol use: No    Frequency: Never  . Drug use: No    Review of Systems Per HPI unless specifically indicated above  Outpatient Encounter Medications as of 08/14/2017  Medication Sig  . acetaminophen (TYLENOL) 325 MG tablet Take by mouth.  Marland Kitchen aspirin 81 MG chewable tablet Chew by mouth.  Marland Kitchen atorvastatin (LIPITOR) 80 MG tablet Take 1 tablet (80 mg total) by mouth daily.  . Blood Glucose Monitoring Suppl (TRUE METRIX METER) w/Device KIT Use to check blood sugar as needed for hypoglycemia up to x 1 daily  . Calcium Carbonate-Vit D-Min (GNP CALCIUM 1200) 1200-1000 MG-UNIT CHEW Chew 1,200 mg by mouth daily with breakfast. Take in combination with vitamin D and magnesium.  . Cholecalciferol (VITAMIN D3) 5000 units CAPS Take 1 capsule (5,000 Units total) by mouth daily with breakfast. Take along with calcium and magnesium.  . cyclobenzaprine (FLEXERIL) 10 MG tablet TAKE 1 TABLET BY MOUTH THREE TIMES A DAY  . DULoxetine (CYMBALTA) 30 MG capsule Take 30 mg by mouth daily.  Marland Kitchen gabapentin (NEURONTIN) 300 MG capsule Take 2 capsules (600 mg total) by mouth every 8 (  eight) hours.  Marland Kitchen ibuprofen (ADVIL,MOTRIN) 200 MG tablet Take 600 mg by mouth every 6 (six) hours as needed for moderate pain.  . Lancets (BD LANCET ULTRAFINE 30G) MISC Use to check blood sugar for hypoglycemia up to x 1 daily as needed  . lidocaine (LIDODERM) 5 % Place onto the skin.  . Magnesium 500 MG CAPS Take 1 capsule (500 mg total) by mouth 2 (two) times daily at 8 am and 10 pm.  . Melatonin 3 MG TABS Take by mouth.  .  Menthol-Methyl Salicylate (GRX ANALGESIC BALM) OINT Apply topically.  . TRUE METRIX BLOOD GLUCOSE TEST test strip Check blood sugar as needed for hypoglycemia up to x1 daily  . [DISCONTINUED] Vitamin D, Ergocalciferol, (DRISDOL) 50000 units CAPS capsule Take by mouth.  Marland Kitchen LORazepam (ATIVAN) 1 MG tablet Take 1 mg by mouth every 8 (eight) hours as needed. for anxiety  . Oxycodone HCl 10 MG TABS   . propranolol ER (INDERAL LA) 80 MG 24 hr capsule Take 1 capsule (80 mg total) by mouth daily.   No facility-administered encounter medications on file as of 08/14/2017.        Objective:    BP (!) 130/94 (BP Location: Left Arm, Patient Position: Sitting) Comment (Patient Position): after discussing anxiety/depression/life changes  Pulse 85   Ht '5\' 9"'$  (1.753 m)   Wt 278 lb (126.1 kg)   BMI 41.05 kg/m   Wt Readings from Last 3 Encounters:  08/14/17 278 lb (126.1 kg)  07/14/17 272 lb 12.8 oz (123.7 kg)  04/26/17 270 lb (122.5 kg)    Physical Exam  Constitutional: He is oriented to person, place, and time. He appears well-developed and well-nourished. No distress.  HENT:  Head: Normocephalic and atraumatic.  Cardiovascular: Normal rate, regular rhythm, S1 normal, S2 normal, normal heart sounds and intact distal pulses.  Pulmonary/Chest: Effort normal and breath sounds normal. No respiratory distress.  Neurological: He is alert and oriented to person, place, and time. He exhibits abnormal muscle tone (left arm/leg). Gait (uses cane, antalgic gait) abnormal.  Photophobia, persistent peripheral left eye vision loss  Skin: Skin is warm and dry. Capillary refill takes less than 2 seconds.  Psychiatric: His behavior is normal. Thought content normal. His speech is delayed and slurred. Cognition and memory are normal. He exhibits a depressed mood. He is communicative.  Vitals reviewed.    Results for orders placed or performed during the hospital encounter of 03/08/17  VITAMIN D 25 Hydroxy (Vit-D  Deficiency, Fractures)  Result Value Ref Range   Vit D, 25-Hydroxy 22.4 (L) 30.0 - 100.0 ng/mL  Sedimentation rate  Result Value Ref Range   Sed Rate 8 0 - 15 mm/hr  Magnesium  Result Value Ref Range   Magnesium 2.1 1.7 - 2.4 mg/dL  Comprehensive metabolic panel  Result Value Ref Range   Sodium 137 135 - 145 mmol/L   Potassium 3.8 3.5 - 5.1 mmol/L   Chloride 104 101 - 111 mmol/L   CO2 26 22 - 32 mmol/L   Glucose, Bld 98 65 - 99 mg/dL   BUN 12 6 - 20 mg/dL   Creatinine, Ser 0.80 0.61 - 1.24 mg/dL   Calcium 8.9 8.9 - 10.3 mg/dL   Total Protein 7.7 6.5 - 8.1 g/dL   Albumin 4.3 3.5 - 5.0 g/dL   AST 20 15 - 41 U/L   ALT 15 (L) 17 - 63 U/L   Alkaline Phosphatase 52 38 - 126 U/L   Total Bilirubin 0.9  0.3 - 1.2 mg/dL   GFR calc non Af Amer >60 >60 mL/min   GFR calc Af Amer >60 >60 mL/min   Anion gap 7 5 - 15      Assessment & Plan:   Problem List Items Addressed This Visit      Other   Adjustment reaction with anxiety and depression    Other Visit Diagnoses    Other secondary hypertension    -  Primary   Relevant Medications   aspirin 81 MG chewable tablet   propranolol ER (INDERAL LA) 80 MG 24 hr capsule   Intractable hemiplegic migraine without status migrainosus       Relevant Medications   aspirin 81 MG chewable tablet   propranolol ER (INDERAL LA) 80 MG 24 hr capsule   Hospital discharge follow-up       Atypical chest pain          Meds ordered this encounter  Medications  . propranolol ER (INDERAL LA) 80 MG 24 hr capsule    Sig: Take 1 capsule (80 mg total) by mouth daily.    Dispense:  30 capsule    Refill:  5    Order Specific Question:   Supervising Provider    Answer:   Olin Hauser [[id number]]    # Anxiety and depression Patient has positive depression screening, admits to frequent anxiety.  Is situational, but patient has no SI/HI and has not had any plans to carry these out.  He has more positive answers to PHQ9 questions as result of stroke  recovery/symptoms from CVA residual.  Total PHQ9 score is not accurate representation of depression severity related to this overestimation of symptoms. - Continue stress management strategies, reframing need for assistance.  Brief intervention discussed today. - Start propranolol as below.  Patient may likely need SSRI in future. - Provided counseling/psychiatry self-referral information - Followup 6 weeks or sooner if needed.  # Hospital Discharge, Hypertension, atypical chest pain Stable since discharge with continued chest pain.  Musculoskeletal, neuropathic, fibromyalgia, vs panic/anxiety as cause for pain are current differentials with cardiac workup negative. - Exertionally, pain is worse and is associated with increase in BP.  Excitatory response may be neurologic (Sympathetic nervous system response) or anxiety/stress and panic attack. - START propranolol XL 80 mg once daily for HR/BP control and possible benefit for migraine and anxiety. - Continue OT/PT as scheduled.  Continue with Neurology appointment.  May change propranolol at that appointment if another treatment approach is preferred by neuro.  # Migraine Patient continues to have daily opthalmic pain with severe headache.  Clinically consistent with migraine, but may be related to post CVA symptoms. - START propranolol XL 80 mg as above.  May need triptan vs topiramate vs CCB in future.  - Patient already taking magnesium 500 mg daily.  Continue. -Followup with neurology and in 6 weeks with me.   I have reviewed the admission H&P, hospital notes, discharge summary, discharge medication list, and have reconciled the current and discharge medications today.   Follow up plan: Return in about 6 weeks (around 09/25/2017) for BP, headache.  A total of 40 minutes was spent face-to-face with this patient. Greater than 50% (35/40 minutes) of this time was spent in counseling and coordination of care with the patient.   Cassell Smiles, DNP, AGPCNP-BC Adult Gerontology Primary Care Nurse Practitioner [facility] 08/14/2017, 1:13 PM

## 2017-08-14 NOTE — Patient Instructions (Addendum)
Lance Carr,   Thank you for coming in to clinic today.  1. START propranolol XL 80 mg once daily for HR, exertional BP elevation, and migraine. - Call clinic if any dizziness or lightheadeness, trouble balancing when standing. - [facility] if needed.   2. Continue with PT/OT as scheduled and with new neurology appointment.  3. Constipation: If still have straining, take 1/2 dose Miralax as needed.  Psych Counseling ONLY Self Referral: 1. [facility]   Address: [address], [place], [place] [address] Hours: Open today  9AM-7PM Phone: [phone]  2. Anell [facility]yl [facility] [address], [facility]  - [facility] Address: [address], [place], [place] [address] Phone: [phone]   PSYCHIATRY / THERAPY-COUSENLING Self Referral [facility]) [address], [address] [place], [id number] Phone: [phone] (Option 1) [email or web address] Dr Lynett FishHansen Su  [facility]al [facility])  [address], [place], [place] [address] Phone: [phone]  [facility], available walk-in 9am-4pm M-F [address] [place], [place] [address] Hours: 9am - 4pm (M-F, walk in available) [phone]   Please schedule a follow-up appointment with Lance Carr, AGNP. Return in about 6 weeks (around 09/25/2017) for BP, headache.  If you have any other questions or concerns, please feel free to call the clinic or send a message through MyChart. You may also schedule an earlier appointment if necessary.  You will receive a survey after today's visit either digitally by e-mail or paper by [employer] mail. Your experiences and feedback matter to us.  Please respond so we know how we are doing as we provide care for you.   Lance McardleLauren Janisse Ghan, DNP, AGNP-BC Adult Gerontology Nurse Practitioner [facility], [facility]

## 2017-08-15 ENCOUNTER — Other Ambulatory Visit: Payer: Self-pay | Admitting: Family Medicine

## 2017-08-15 DIAGNOSIS — I10 Essential (primary) hypertension: Secondary | ICD-10-CM | POA: Insufficient documentation

## 2017-08-15 DIAGNOSIS — E162 Hypoglycemia, unspecified: Secondary | ICD-10-CM

## 2017-08-22 ENCOUNTER — Encounter: Payer: Medicare Other | Admitting: Nurse Practitioner

## 2017-08-22 ENCOUNTER — Inpatient Hospital Stay: Payer: Medicare Other | Admitting: Nurse Practitioner

## 2017-08-22 ENCOUNTER — Ambulatory Visit: Payer: Medicare Other

## 2017-08-24 DIAGNOSIS — Z7289 Other problems related to lifestyle: Secondary | ICD-10-CM | POA: Diagnosis not present

## 2017-08-24 DIAGNOSIS — I639 Cerebral infarction, unspecified: Secondary | ICD-10-CM | POA: Diagnosis not present

## 2017-08-24 DIAGNOSIS — G4452 New daily persistent headache (NDPH): Secondary | ICD-10-CM | POA: Diagnosis not present

## 2017-08-25 DIAGNOSIS — G894 Chronic pain syndrome: Secondary | ICD-10-CM | POA: Diagnosis not present

## 2017-08-25 DIAGNOSIS — F5104 Psychophysiologic insomnia: Secondary | ICD-10-CM | POA: Diagnosis not present

## 2017-08-25 DIAGNOSIS — G8929 Other chronic pain: Secondary | ICD-10-CM | POA: Diagnosis not present

## 2017-08-25 DIAGNOSIS — F419 Anxiety disorder, unspecified: Secondary | ICD-10-CM | POA: Diagnosis not present

## 2017-08-25 DIAGNOSIS — G43911 Migraine, unspecified, intractable, with status migrainosus: Secondary | ICD-10-CM | POA: Diagnosis not present

## 2017-08-25 DIAGNOSIS — M797 Fibromyalgia: Secondary | ICD-10-CM | POA: Diagnosis not present

## 2017-08-25 DIAGNOSIS — M545 Low back pain: Secondary | ICD-10-CM | POA: Diagnosis not present

## 2017-08-25 DIAGNOSIS — Z0289 Encounter for other administrative examinations: Secondary | ICD-10-CM | POA: Diagnosis not present

## 2017-08-25 DIAGNOSIS — I639 Cerebral infarction, unspecified: Secondary | ICD-10-CM | POA: Diagnosis not present

## 2017-09-09 ENCOUNTER — Other Ambulatory Visit: Payer: Self-pay | Admitting: Nurse Practitioner

## 2017-09-09 DIAGNOSIS — M792 Neuralgia and neuritis, unspecified: Secondary | ICD-10-CM

## 2017-09-09 DIAGNOSIS — I6930 Unspecified sequelae of cerebral infarction: Secondary | ICD-10-CM

## 2017-09-13 ENCOUNTER — Ambulatory Visit: Payer: Medicare Other | Admitting: Nurse Practitioner

## 2017-09-15 DIAGNOSIS — F438 Other reactions to severe stress: Secondary | ICD-10-CM | POA: Diagnosis not present

## 2017-09-15 DIAGNOSIS — M545 Low back pain: Secondary | ICD-10-CM | POA: Diagnosis not present

## 2017-09-15 DIAGNOSIS — I208 Other forms of angina pectoris: Secondary | ICD-10-CM | POA: Diagnosis not present

## 2017-09-15 DIAGNOSIS — I639 Cerebral infarction, unspecified: Secondary | ICD-10-CM | POA: Diagnosis not present

## 2017-09-15 DIAGNOSIS — M797 Fibromyalgia: Secondary | ICD-10-CM | POA: Diagnosis not present

## 2017-09-15 DIAGNOSIS — M47817 Spondylosis without myelopathy or radiculopathy, lumbosacral region: Secondary | ICD-10-CM | POA: Diagnosis not present

## 2017-09-15 DIAGNOSIS — R410 Disorientation, unspecified: Secondary | ICD-10-CM | POA: Diagnosis not present

## 2017-09-15 DIAGNOSIS — S098XXA Other specified injuries of head, initial encounter: Secondary | ICD-10-CM | POA: Diagnosis not present

## 2017-09-15 DIAGNOSIS — F444 Conversion disorder with motor symptom or deficit: Secondary | ICD-10-CM | POA: Diagnosis not present

## 2017-09-15 DIAGNOSIS — M6281 Muscle weakness (generalized): Secondary | ICD-10-CM | POA: Diagnosis not present

## 2017-09-15 DIAGNOSIS — M5416 Radiculopathy, lumbar region: Secondary | ICD-10-CM | POA: Diagnosis not present

## 2017-09-15 DIAGNOSIS — G8194 Hemiplegia, unspecified affecting left nondominant side: Secondary | ICD-10-CM | POA: Diagnosis not present

## 2017-09-15 DIAGNOSIS — Z6841 Body Mass Index (BMI) 40.0 and over, adult: Secondary | ICD-10-CM | POA: Diagnosis not present

## 2017-09-15 DIAGNOSIS — G8929 Other chronic pain: Secondary | ICD-10-CM | POA: Diagnosis not present

## 2017-09-15 DIAGNOSIS — R000 Tachycardia, unspecified: Secondary | ICD-10-CM | POA: Diagnosis not present

## 2017-09-18 DIAGNOSIS — M6281 Muscle weakness (generalized): Secondary | ICD-10-CM | POA: Diagnosis not present

## 2017-09-18 DIAGNOSIS — S199XXA Unspecified injury of neck, initial encounter: Secondary | ICD-10-CM | POA: Diagnosis not present

## 2017-09-18 DIAGNOSIS — Z7982 Long term (current) use of aspirin: Secondary | ICD-10-CM | POA: Diagnosis not present

## 2017-09-18 DIAGNOSIS — R569 Unspecified convulsions: Secondary | ICD-10-CM | POA: Diagnosis not present

## 2017-09-18 DIAGNOSIS — E669 Obesity, unspecified: Secondary | ICD-10-CM | POA: Diagnosis present

## 2017-09-18 DIAGNOSIS — I10 Essential (primary) hypertension: Secondary | ICD-10-CM | POA: Diagnosis present

## 2017-09-18 DIAGNOSIS — F438 Other reactions to severe stress: Secondary | ICD-10-CM | POA: Diagnosis present

## 2017-09-18 DIAGNOSIS — M797 Fibromyalgia: Secondary | ICD-10-CM | POA: Diagnosis present

## 2017-09-18 DIAGNOSIS — R29810 Facial weakness: Secondary | ICD-10-CM | POA: Diagnosis present

## 2017-09-18 DIAGNOSIS — Z8679 Personal history of other diseases of the circulatory system: Secondary | ICD-10-CM | POA: Diagnosis not present

## 2017-09-18 DIAGNOSIS — S098XXA Other specified injuries of head, initial encounter: Secondary | ICD-10-CM | POA: Diagnosis present

## 2017-09-18 DIAGNOSIS — Z9884 Bariatric surgery status: Secondary | ICD-10-CM | POA: Diagnosis not present

## 2017-09-18 DIAGNOSIS — G44209 Tension-type headache, unspecified, not intractable: Secondary | ICD-10-CM | POA: Diagnosis present

## 2017-09-18 DIAGNOSIS — Z86718 Personal history of other venous thrombosis and embolism: Secondary | ICD-10-CM | POA: Diagnosis not present

## 2017-09-18 DIAGNOSIS — G8194 Hemiplegia, unspecified affecting left nondominant side: Secondary | ICD-10-CM | POA: Diagnosis present

## 2017-09-18 DIAGNOSIS — Z6841 Body Mass Index (BMI) 40.0 and over, adult: Secondary | ICD-10-CM | POA: Diagnosis not present

## 2017-09-18 DIAGNOSIS — E785 Hyperlipidemia, unspecified: Secondary | ICD-10-CM | POA: Diagnosis present

## 2017-09-18 DIAGNOSIS — R4781 Slurred speech: Secondary | ICD-10-CM | POA: Diagnosis present

## 2017-09-18 DIAGNOSIS — R471 Dysarthria and anarthria: Secondary | ICD-10-CM | POA: Diagnosis present

## 2017-09-18 DIAGNOSIS — I208 Other forms of angina pectoris: Secondary | ICD-10-CM | POA: Diagnosis present

## 2017-09-18 DIAGNOSIS — G894 Chronic pain syndrome: Secondary | ICD-10-CM | POA: Diagnosis present

## 2017-09-18 DIAGNOSIS — R51 Headache: Secondary | ICD-10-CM | POA: Diagnosis not present

## 2017-09-18 DIAGNOSIS — I639 Cerebral infarction, unspecified: Secondary | ICD-10-CM | POA: Diagnosis not present

## 2017-09-18 DIAGNOSIS — R000 Tachycardia, unspecified: Secondary | ICD-10-CM | POA: Diagnosis not present

## 2017-09-18 DIAGNOSIS — F444 Conversion disorder with motor symptom or deficit: Secondary | ICD-10-CM | POA: Diagnosis present

## 2017-09-18 DIAGNOSIS — R4182 Altered mental status, unspecified: Secondary | ICD-10-CM | POA: Diagnosis not present

## 2017-09-18 DIAGNOSIS — Z6834 Body mass index (BMI) 34.0-34.9, adult: Secondary | ICD-10-CM | POA: Diagnosis not present

## 2017-09-18 DIAGNOSIS — R531 Weakness: Secondary | ICD-10-CM | POA: Diagnosis not present

## 2017-09-18 DIAGNOSIS — F446 Conversion disorder with sensory symptom or deficit: Secondary | ICD-10-CM | POA: Diagnosis not present

## 2017-09-18 DIAGNOSIS — R4789 Other speech disturbances: Secondary | ICD-10-CM | POA: Diagnosis not present

## 2017-09-18 DIAGNOSIS — E876 Hypokalemia: Secondary | ICD-10-CM | POA: Diagnosis not present

## 2017-09-18 DIAGNOSIS — W0110XD Fall on same level from slipping, tripping and stumbling with subsequent striking against unspecified object, subsequent encounter: Secondary | ICD-10-CM | POA: Diagnosis not present

## 2017-09-18 DIAGNOSIS — S0990XD Unspecified injury of head, subsequent encounter: Secondary | ICD-10-CM | POA: Diagnosis not present

## 2017-09-18 DIAGNOSIS — R410 Disorientation, unspecified: Secondary | ICD-10-CM | POA: Diagnosis not present

## 2017-09-18 DIAGNOSIS — G43909 Migraine, unspecified, not intractable, without status migrainosus: Secondary | ICD-10-CM | POA: Diagnosis present

## 2017-09-18 DIAGNOSIS — H5319 Other subjective visual disturbances: Secondary | ICD-10-CM | POA: Diagnosis not present

## 2017-09-18 DIAGNOSIS — F419 Anxiety disorder, unspecified: Secondary | ICD-10-CM | POA: Diagnosis present

## 2017-09-18 DIAGNOSIS — Z87891 Personal history of nicotine dependence: Secondary | ICD-10-CM | POA: Diagnosis not present

## 2017-09-18 DIAGNOSIS — M545 Low back pain: Secondary | ICD-10-CM | POA: Diagnosis present

## 2017-09-18 DIAGNOSIS — R29712 NIHSS score 12: Secondary | ICD-10-CM | POA: Diagnosis present

## 2017-09-18 DIAGNOSIS — I69354 Hemiplegia and hemiparesis following cerebral infarction affecting left non-dominant side: Secondary | ICD-10-CM | POA: Diagnosis not present

## 2017-09-19 DIAGNOSIS — R569 Unspecified convulsions: Secondary | ICD-10-CM | POA: Diagnosis not present

## 2017-09-22 DIAGNOSIS — G44219 Episodic tension-type headache, not intractable: Secondary | ICD-10-CM | POA: Insufficient documentation

## 2017-09-22 DIAGNOSIS — F444 Conversion disorder with motor symptom or deficit: Secondary | ICD-10-CM | POA: Insufficient documentation

## 2017-09-22 DIAGNOSIS — R931 Abnormal findings on diagnostic imaging of heart and coronary circulation: Secondary | ICD-10-CM | POA: Insufficient documentation

## 2017-09-22 MED ORDER — LIDOCAINE 5 % EX PTCH
1.00 | MEDICATED_PATCH | CUTANEOUS | Status: DC
Start: ? — End: 2017-09-22

## 2017-09-22 MED ORDER — POLYETHYLENE GLYCOL 3350 17 G PO PACK
17.00 | PACK | ORAL | Status: DC
Start: ? — End: 2017-09-22

## 2017-09-22 MED ORDER — VERAPAMIL HCL 80 MG PO TABS
40.00 | ORAL_TABLET | ORAL | Status: DC
Start: 2017-09-22 — End: 2017-09-22

## 2017-09-22 MED ORDER — ENOXAPARIN SODIUM 30 MG/0.3ML ~~LOC~~ SOLN
30.00 | SUBCUTANEOUS | Status: DC
Start: 2017-09-22 — End: 2017-09-22

## 2017-09-22 MED ORDER — GENERIC EXTERNAL MEDICATION
6.00 | Status: DC
Start: ? — End: 2017-09-22

## 2017-09-22 MED ORDER — GENERIC EXTERNAL MEDICATION
2.00 | Status: DC
Start: 2017-09-22 — End: 2017-09-22

## 2017-09-22 MED ORDER — MAGNESIUM OXIDE 400 MG PO TABS
400.00 | ORAL_TABLET | ORAL | Status: DC
Start: 2017-09-23 — End: 2017-09-22

## 2017-09-22 MED ORDER — DULOXETINE HCL 60 MG PO CPEP
60.00 | ORAL_CAPSULE | ORAL | Status: DC
Start: 2017-09-23 — End: 2017-09-22

## 2017-09-22 MED ORDER — ATORVASTATIN CALCIUM 80 MG PO TABS
80.00 | ORAL_TABLET | ORAL | Status: DC
Start: 2017-09-22 — End: 2017-09-22

## 2017-09-22 MED ORDER — OXYCODONE HCL 5 MG PO TABS
5.00 | ORAL_TABLET | ORAL | Status: DC
Start: ? — End: 2017-09-22

## 2017-09-22 MED ORDER — DEXTROSE 10 % IV SOLN
12.50 | INTRAVENOUS | Status: DC
Start: ? — End: 2017-09-22

## 2017-09-22 MED ORDER — TIZANIDINE HCL 4 MG PO TABS
4.00 | ORAL_TABLET | ORAL | Status: DC
Start: 2017-09-23 — End: 2017-09-22

## 2017-09-22 MED ORDER — ACETAMINOPHEN 325 MG PO TABS
650.00 | ORAL_TABLET | ORAL | Status: DC
Start: ? — End: 2017-09-22

## 2017-09-22 MED ORDER — GABAPENTIN 300 MG PO CAPS
600.00 | ORAL_CAPSULE | ORAL | Status: DC
Start: 2017-09-22 — End: 2017-09-22

## 2017-09-22 MED ORDER — GRX ANALGESIC BALM EX OINT
1.00 | TOPICAL_OINTMENT | CUTANEOUS | Status: DC
Start: ? — End: 2017-09-22

## 2017-09-22 MED ORDER — ASPIRIN 81 MG PO CHEW
81.00 | CHEWABLE_TABLET | ORAL | Status: DC
Start: 2017-09-23 — End: 2017-09-22

## 2017-09-26 ENCOUNTER — Ambulatory Visit: Payer: Medicare Other | Admitting: Nurse Practitioner

## 2017-10-02 ENCOUNTER — Encounter: Payer: Self-pay | Admitting: Nurse Practitioner

## 2017-10-02 ENCOUNTER — Other Ambulatory Visit: Payer: Self-pay

## 2017-10-02 ENCOUNTER — Ambulatory Visit (INDEPENDENT_AMBULATORY_CARE_PROVIDER_SITE_OTHER): Payer: Medicare Other | Admitting: Nurse Practitioner

## 2017-10-02 VITALS — BP 120/67 | HR 75 | Temp 98.4°F | Ht 69.0 in | Wt 289.0 lb

## 2017-10-02 DIAGNOSIS — F444 Conversion disorder with motor symptom or deficit: Secondary | ICD-10-CM | POA: Diagnosis not present

## 2017-10-02 DIAGNOSIS — Z09 Encounter for follow-up examination after completed treatment for conditions other than malignant neoplasm: Secondary | ICD-10-CM

## 2017-10-02 DIAGNOSIS — F419 Anxiety disorder, unspecified: Secondary | ICD-10-CM

## 2017-10-02 MED ORDER — DULOXETINE HCL 40 MG PO CPEP: 40.0000 mg | ORAL_CAPSULE | Freq: Every day | ORAL | 3 refills | Status: DC

## 2017-10-02 NOTE — Patient Instructions (Addendum)
Lance Carr,   Thank you for coming in to clinic today.  1. Continue staying active, but continue to pay attention to your symptoms and rest when needed.  2. INCREASE dose of duloxetine to 40 mg once daily.   3. You may need to start another medication for anxiety.    4. Keep a log of headaches, stressors, and your facial tingling.  If they are happening more often, call clinic or neurology.  If they are improving (happening less often), wonderful.    Please schedule a follow-up appointment with Lance Carr, AGNP. Return in about 2 months (around 12/02/2017) for anxiety.  If you have any other questions or concerns, please feel free to call the clinic or send a message through MyChart. You may also schedule an earlier appointment if necessary.  You will receive a survey after today's visit either digitally by e-mail or paper by [employer] mail. Your experiences and feedback matter to us.  Please respond so we know how we are doing as we provide care for you.   Lance McardleLauren Jilleen Essner, DNP, AGNP-BC Adult Gerontology Nurse Practitioner [facility], [facility]

## 2017-10-02 NOTE — Progress Notes (Signed)
Subjective:    Patient ID: Lance Carr, male    DOB: 1970-08-08, 47 y.o.   MRN: [id number]  Lance Carr is a 47 y.o. 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 - Has periods of labile moods - Really happy, really sad, anxious gets left sided facial tingling immediately.  Is driving again.   - Is back to outdoor yardwork.  Keeps limits and takes break to cool off. Lasts more than 1 hour, less than 6 hours.   - Continues to have some visual field limitations.  Sees black with white spots and is sometimes all he sees  Social History   Tobacco Use  . Smoking status: Former Smoker    Years: 20.00    Last attempt to quit: 03/01/2005    Years since quitting: 12.5  . Smokeless tobacco: Never Used  Substance Use Topics  . Alcohol use: No    Frequency: Never  . Drug use: No    Review of Systems Per HPI unless specifically indicated above  Outpatient Encounter Medications as of 10/02/2017  Medication Sig  . atorvastatin (LIPITOR) 80 MG tablet Take 1 tablet (80 mg total) by mouth daily.  . Blood Glucose Monitoring Suppl (TRUE METRIX METER) w/Device KIT Use to check blood sugar as needed for hypoglycemia up to x 1 daily  . Calcium Carbonate-Vit D-Min (GNP CALCIUM 1200) 1200-1000 MG-UNIT CHEW Chew 1,200 mg by mouth daily with breakfast. Take in combination with vitamin D and magnesium.  . Cholecalciferol (VITAMIN D3) 5000 units CAPS Take 1 capsule (5,000 Units total) by mouth daily with breakfast. Take  along with calcium and magnesium.  . DULoxetine (CYMBALTA) 30 MG capsule Take 30 mg by mouth daily.  Marland Kitchen gabapentin (NEURONTIN) 300 MG capsule TAKE 2 CAPSULES (600 MG TOTAL) BY MOUTH EVERY 8 (EIGHT) HOURS.  . Lancets (BD LANCET ULTRAFINE 30G) MISC Use to check blood sugar for hypoglycemia up to x 1 daily as needed  . Menthol-Methyl Salicylate (GRX ANALGESIC BALM) OINT Apply topically.  . Oxycodone HCl 10 MG TABS   . tiZANidine (ZANAFLEX) 2 MG tablet Take 6 mg by mouth at bedtime.  Marland Kitchen tiZANidine (ZANAFLEX) 4 MG tablet Take 4 mg by mouth 2 (two) times daily.  . TRUE METRIX BLOOD GLUCOSE TEST test strip Check blood sugar as needed for hypoglycemia up to x1 daily  . verapamil (CALAN) 40 MG tablet Take 40 mg by mouth 3 (three) times daily.  Marland Kitchen acetaminophen (TYLENOL) 325 MG tablet Take by mouth.  Marland Kitchen aspirin 81 MG chewable tablet Chew by mouth.  . cyclobenzaprine (FLEXERIL) 10 MG tablet TAKE 1 TABLET BY MOUTH THREE TIMES A DAY (Patient not taking: Reported on 10/02/2017)  . ibuprofen (ADVIL,MOTRIN) 200 MG tablet Take 600 mg by mouth every 6 (six) hours as needed for moderate pain.  Marland Kitchen LORazepam (ATIVAN) 1 MG tablet Take 1 mg by mouth every 8 (eight) hours as needed. for anxiety  . Magnesium 500 MG CAPS Take 1 capsule (500 mg total) by mouth 2 (two) times daily at 8 am and 10 pm. (Patient not taking: Reported on 10/02/2017)  .  Melatonin 3 MG TABS Take by mouth.  . propranolol ER (INDERAL LA) 80 MG 24 hr capsule Take 1 capsule (80 mg total) by mouth daily. (Patient not taking: Reported on 10/02/2017)   No facility-administered encounter medications on file as of 10/02/2017.       Objective:    BP 120/67 (BP Location: Right Arm, Patient Position: Sitting, Cuff Size: Large)   Pulse 75   Temp 98.4 F (36.9 C) (Oral)   Ht _0  (1.753 m)   Wt 289 lb (131.1 kg)   BMI 42.68 kg/m   Wt Readings from Last 3 Encounters:  10/02/17 289 lb (131.1 kg)  08/14/17 278 lb (126.1 kg)  07/14/17 272 lb 12.8 oz (123.7  kg)    Physical Exam  Constitutional: He is oriented to person, place, and time. He appears well-developed and well-nourished. No distress.  HENT:  Head: Normocephalic and atraumatic.  Cardiovascular: Normal rate, regular rhythm, S1 normal, S2 normal, normal heart sounds and intact distal pulses.  Pulmonary/Chest: Effort normal and breath sounds normal. No respiratory distress.  Neurological: He is alert and oriented to person, place, and time. He has normal strength and normal reflexes. No cranial nerve deficit or sensory deficit. He displays a negative Romberg sign. Gait normal.  Minimally reduced left eye peripheral vision field.  Skin: Skin is warm and dry. Capillary refill takes less than 2 seconds.  Psychiatric: He has a normal mood and affect. His behavior is normal. Judgment and thought content normal.  Vitals reviewed.  Results for orders placed or performed during the hospital encounter of 03/08/17  VITAMIN D 25 Hydroxy (Vit-D Deficiency, Fractures)  Result Value Ref Range   Vit D, 25-Hydroxy 22.4 (L) 30.0 - 100.0 ng/mL  Sedimentation rate  Result Value Ref Range   Sed Rate 8 0 - 15 mm/hr  Magnesium  Result Value Ref Range   Magnesium 2.1 1.7 - 2.4 mg/dL  Comprehensive metabolic panel  Result Value Ref Range   Sodium 137 135 - 145 mmol/L   Potassium 3.8 3.5 - 5.1 mmol/L   Chloride 104 101 - 111 mmol/L   CO2 26 22 - 32 mmol/L   Glucose, Bld 98 65 - 99 mg/dL   BUN 12 6 - 20 mg/dL   Creatinine, Ser 0.80 0.61 - 1.24 mg/dL   Calcium 8.9 8.9 - 10.3 mg/dL   Total Protein 7.7 6.5 - 8.1 g/dL   Albumin 4.3 3.5 - 5.0 g/dL   AST 20 15 - 41 U/L   ALT 15 (L) 17 - 63 U/L   Alkaline Phosphatase 52 38 - 126 U/L   Total Bilirubin 0.9 0.3 - 1.2 mg/dL   GFR calc non Af Amer >60 >60 mL/min   GFR calc Af Amer >60 >60 mL/min   Anion gap 7 5 - 15      Assessment & Plan:   Problem List Items Addressed This Visit      Nervous and Auditory   Conversion disorder with weakness or  paralysis, acute episode, 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  New dx of conversion disorder with weakness/paralysis.  Patient also notes significant anxiety with mood lability.  - Follow-up psychiatry in future if needed. - Continue duloxetine - increase to 40 mg daily. - Consider adjunctive therapy for anxiety - Continue non-pharm stress  management. - continue daily activity as tolerated with frequent rest breaks. - Follow-up 2 months anxiety.  Meds ordered this encounter  Medications  .  DULoxetine 40 MG CPEP    Sig: Take 40 mg by mouth daily.    Dispense:  30 capsule    Refill:  3    Order Specific Question:   Supervising Provider    Answer:   Olin Hauser [[id number]]   I have reviewed the admission H&P, hospital notes, discharge summary, discharge medication list, and have reconciled the current and discharge medications today.  Follow up plan: Return in about 2 months (around 12/02/2017) for anxiety.  Cassell Smiles, DNP, AGPCNP-BC Adult Gerontology Primary Care Nurse Practitioner [facility] 10/02/2017, 2:11 PM

## 2017-10-04 ENCOUNTER — Other Ambulatory Visit: Payer: Self-pay | Admitting: Nurse Practitioner

## 2017-10-04 DIAGNOSIS — I6930 Unspecified sequelae of cerebral infarction: Secondary | ICD-10-CM

## 2017-10-04 DIAGNOSIS — M792 Neuralgia and neuritis, unspecified: Secondary | ICD-10-CM

## 2017-10-08 ENCOUNTER — Other Ambulatory Visit: Payer: Self-pay | Admitting: Nurse Practitioner

## 2017-10-08 DIAGNOSIS — M5442 Lumbago with sciatica, left side: Principal | ICD-10-CM

## 2017-10-08 DIAGNOSIS — G8929 Other chronic pain: Secondary | ICD-10-CM

## 2017-10-08 DIAGNOSIS — M5441 Lumbago with sciatica, right side: Principal | ICD-10-CM

## 2017-10-08 DIAGNOSIS — F419 Anxiety disorder, unspecified: Secondary | ICD-10-CM

## 2017-10-09 MED ORDER — DULOXETINE HCL 40 MG PO CPEP: 40.0000 mg | ORAL_CAPSULE | Freq: Every day | ORAL | 3 refills | Status: DC

## 2017-10-10 ENCOUNTER — Telehealth: Payer: Self-pay | Admitting: Nurse Practitioner

## 2017-10-10 NOTE — Telephone Encounter (Signed)
The pt wife was notified, no questions or concerns.

## 2017-10-10 NOTE — Telephone Encounter (Signed)
Pt needs ref[facility] [place]   tiZANidine (ZANAFLEX) 2 MG tablet [[id number]   tiZANidine (ZANAFLEX) 4 MG tablet [[id number]

## 2017-10-12 ENCOUNTER — Telehealth: Payer: Self-pay | Admitting: Nurse Practitioner

## 2017-10-12 DIAGNOSIS — F419 Anxiety disorder, unspecified: Secondary | ICD-10-CM

## 2017-10-12 DIAGNOSIS — G8929 Other chronic pain: Secondary | ICD-10-CM

## 2017-10-12 DIAGNOSIS — M47817 Spondylosis without myelopathy or radiculopathy, lumbosacral region: Secondary | ICD-10-CM | POA: Diagnosis not present

## 2017-10-12 DIAGNOSIS — M5442 Lumbago with sciatica, left side: Principal | ICD-10-CM

## 2017-10-12 DIAGNOSIS — M47816 Spondylosis without myelopathy or radiculopathy, lumbar region: Secondary | ICD-10-CM | POA: Diagnosis not present

## 2017-10-12 MED ORDER — DULOXETINE HCL 20 MG PO CPEP: 40.0000 mg | ORAL_CAPSULE | Freq: Every day | ORAL | 5 refills | Status: DC

## 2017-10-12 NOTE — Telephone Encounter (Signed)
Pt's insurance is not covering the 40 mg duloxetine.  His call back number is [phone]

## 2017-10-26 DIAGNOSIS — F444 Conversion disorder with motor symptom or deficit: Secondary | ICD-10-CM | POA: Diagnosis not present

## 2017-10-26 DIAGNOSIS — I69998 Other sequelae following unspecified cerebrovascular disease: Secondary | ICD-10-CM | POA: Diagnosis not present

## 2017-10-26 DIAGNOSIS — N50819 Testicular pain, unspecified: Secondary | ICD-10-CM | POA: Diagnosis not present

## 2017-10-26 DIAGNOSIS — I10 Essential (primary) hypertension: Secondary | ICD-10-CM | POA: Diagnosis not present

## 2017-10-26 DIAGNOSIS — M47816 Spondylosis without myelopathy or radiculopathy, lumbar region: Secondary | ICD-10-CM | POA: Diagnosis not present

## 2017-10-26 DIAGNOSIS — G8929 Other chronic pain: Secondary | ICD-10-CM | POA: Diagnosis not present

## 2017-10-26 DIAGNOSIS — Z539 Procedure and treatment not carried out, unspecified reason: Secondary | ICD-10-CM | POA: Diagnosis not present

## 2017-10-26 DIAGNOSIS — R531 Weakness: Secondary | ICD-10-CM | POA: Diagnosis not present

## 2017-10-26 DIAGNOSIS — M797 Fibromyalgia: Secondary | ICD-10-CM | POA: Diagnosis not present

## 2017-11-07 ENCOUNTER — Other Ambulatory Visit: Payer: Self-pay | Admitting: Pain Medicine

## 2017-11-07 ENCOUNTER — Other Ambulatory Visit: Payer: Self-pay | Admitting: Nurse Practitioner

## 2017-11-07 DIAGNOSIS — G8929 Other chronic pain: Secondary | ICD-10-CM

## 2017-11-07 DIAGNOSIS — M5442 Lumbago with sciatica, left side: Principal | ICD-10-CM

## 2017-11-07 DIAGNOSIS — E559 Vitamin D deficiency, unspecified: Secondary | ICD-10-CM

## 2017-11-09 DIAGNOSIS — G894 Chronic pain syndrome: Secondary | ICD-10-CM | POA: Diagnosis not present

## 2017-11-09 DIAGNOSIS — M47816 Spondylosis without myelopathy or radiculopathy, lumbar region: Secondary | ICD-10-CM | POA: Diagnosis not present

## 2017-11-09 DIAGNOSIS — M5416 Radiculopathy, lumbar region: Secondary | ICD-10-CM | POA: Diagnosis not present

## 2017-12-01 ENCOUNTER — Ambulatory Visit: Payer: Medicare Other | Admitting: Nurse Practitioner

## 2017-12-08 ENCOUNTER — Encounter: Payer: Self-pay | Admitting: Nurse Practitioner

## 2017-12-27 ENCOUNTER — Telehealth: Payer: Self-pay | Admitting: Nurse Practitioner

## 2017-12-27 DIAGNOSIS — F524 Premature ejaculation: Secondary | ICD-10-CM

## 2017-12-27 NOTE — Telephone Encounter (Signed)
Wife called said that pt have had diarrhea for 6 days, stomach cramps. He also wanted to speak to you about something in his chart that you and he have talked about after his stroke. Pt call back # is  [phone]

## 2017-12-27 NOTE — Telephone Encounter (Signed)
Is keeping some food down yesterday.  Took Immodium AD yesterday with some relief.  Cramping is bothersome.  Today has improved some. - May continue using immodium AD prn.  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  Patient had no difficulty with this prior to his stroke.   - Encouraged patient to focus on partner stimulation prior to intercourse, consider using other desensitizing agents if desired while waiting to see urology.  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

## 2018-01-02 ENCOUNTER — Other Ambulatory Visit: Payer: Self-pay | Admitting: Nurse Practitioner

## 2018-01-02 DIAGNOSIS — M792 Neuralgia and neuritis, unspecified: Secondary | ICD-10-CM

## 2018-01-02 DIAGNOSIS — I6930 Unspecified sequelae of cerebral infarction: Secondary | ICD-10-CM

## 2018-01-11 DIAGNOSIS — M797 Fibromyalgia: Secondary | ICD-10-CM | POA: Diagnosis not present

## 2018-01-11 DIAGNOSIS — Z86718 Personal history of other venous thrombosis and embolism: Secondary | ICD-10-CM | POA: Diagnosis not present

## 2018-01-11 DIAGNOSIS — G894 Chronic pain syndrome: Secondary | ICD-10-CM | POA: Diagnosis not present

## 2018-01-11 DIAGNOSIS — G8929 Other chronic pain: Secondary | ICD-10-CM | POA: Diagnosis not present

## 2018-01-11 DIAGNOSIS — M542 Cervicalgia: Secondary | ICD-10-CM | POA: Diagnosis not present

## 2018-01-11 DIAGNOSIS — M545 Low back pain: Secondary | ICD-10-CM | POA: Diagnosis not present

## 2018-01-11 DIAGNOSIS — I10 Essential (primary) hypertension: Secondary | ICD-10-CM | POA: Diagnosis not present

## 2018-01-11 DIAGNOSIS — R531 Weakness: Secondary | ICD-10-CM | POA: Diagnosis not present

## 2018-01-11 DIAGNOSIS — M2550 Pain in unspecified joint: Secondary | ICD-10-CM | POA: Diagnosis not present

## 2018-01-11 DIAGNOSIS — Z8673 Personal history of transient ischemic attack (TIA), and cerebral infarction without residual deficits: Secondary | ICD-10-CM | POA: Diagnosis not present

## 2018-02-06 ENCOUNTER — Ambulatory Visit: Payer: Medicare Other | Admitting: Urology

## 2018-03-06 ENCOUNTER — Ambulatory Visit (INDEPENDENT_AMBULATORY_CARE_PROVIDER_SITE_OTHER): Payer: Medicare Other

## 2018-03-06 VITALS — BP 148/88 | HR 66 | Temp 97.7°F | Resp 17 | Ht 69.0 in | Wt 289.2 lb

## 2018-03-06 DIAGNOSIS — Z0000 Encounter for general adult medical examination without abnormal findings: Secondary | ICD-10-CM | POA: Diagnosis not present

## 2018-03-06 NOTE — Patient Instructions (Signed)
[name] , Thank you for taking time to come for your Medicare Wellness Visit. I appreciate your ongoing commitment to your health goals. Please review the following plan we discussed and let me know if I can assist you in the future.   Screening recommendations/referrals: Colonoscopy: due- declined Recommended yearly ophthalmology/optometry visit for glaucoma screening and checkup Recommended yearly dental visit for hygiene and checkup  Vaccinations: Influenza vaccine: due now- declined  Pneumococcal vaccine: due at age 62 Tdap vaccine: completed   Shingles vaccine: shingrix eligible, check with your insurance company for coverage    Advanced directives: Advance directive discussed with you today. I have provided a copy for you to complete at home and have notarized. Once this is complete please bring a copy in to our office so we can scan it into your chart.  Conditions/risks identified: please call if your depression worsens or you feel homicidal or suicidal ideations.  Next appointment: Follow up in one year for your annual wellness exam.   Preventive Care 40-64 Years, Male Preventive care refers to lifestyle choices and visits with your health care provider that can promote health and wellness. What does preventive care include?  A yearly physical exam. This is also called an annual well check.  Dental exams once or twice a year.  Routine eye exams. Ask your health care provider how often you should have your eyes checked.  Personal lifestyle choices, including:  Daily care of your teeth and gums.  Regular physical activity.  Eating a healthy diet.  Avoiding tobacco and drug use.  Limiting alcohol use.  Practicing safe sex.  Taking low-dose aspirin every day starting at age 37. What happens during an annual well check? The services and screenings done by your health care provider during your annual well check will depend on your age, overall health, lifestyle  risk factors, and family history of disease. Counseling  Your health care provider may ask you questions about your:  Alcohol use.  Tobacco use.  Drug use.  Emotional well-being.  Home and relationship well-being.  Sexual activity.  Eating habits.  Work and work Astronomer. Screening  You may have the following tests or measurements:  Height, weight, and BMI.  Blood pressure.  Lipid and cholesterol levels. These may be checked every 5 years, or more frequently if you are over 80 years old.  Skin check.  Lung cancer screening. You may have this screening every year starting at age 80 if you have a 30-pack-year history of smoking and currently smoke or have quit within the past 15 years.  Fecal occult blood test (FOBT) of the stool. You may have this test every year starting at age 98.  Flexible sigmoidoscopy or colonoscopy. You may have a sigmoidoscopy every 5 years or a colonoscopy every 10 years starting at age 87.  Prostate cancer screening. Recommendations will vary depending on your family history and other risks.  Hepatitis C blood test.  Hepatitis B blood test.  Sexually transmitted disease (STD) testing.  Diabetes screening. This is done by checking your blood sugar (glucose) after you have not eaten for a while (fasting). You may have this done every 1-3 years. Discuss your test results, treatment options, and if necessary, the need for more tests with your health care provider. Vaccines  Your health care provider may recommend certain vaccines, such as:  Influenza vaccine. This is recommended every year.  Tetanus, diphtheria, and acellular pertussis (Tdap, Td) vaccine. You may need a Td booster every 10  years.  Zoster vaccine. You may need this after age 89.  Pneumococcal 13-valent conjugate (PCV13) vaccine. You may need this if you have certain conditions and have not been vaccinated.  Pneumococcal polysaccharide (PPSV23) vaccine. You may need one  or two doses if you smoke cigarettes or if you have certain conditions. Talk to your health care provider about which screenings and vaccines you need and how often you need them. This information is not intended to replace advice given to you by your health care provider. Make sure you discuss any questions you have with your health care provider. Document Released: 01/30/2015 Document Revised: 09/23/2015 Document Reviewed: 11/04/2014 Elsevier Interactive Patient Education  2017 [place] Prevention in the Home Falls can cause injuries. They can happen to people of all ages. There are many things you can do to make your home safe and to help prevent falls. What can I do on the outside of my home?  Regularly fix the edges of walkways and driveways and fix any cracks.  Remove anything that might make you trip as you walk through a door, such as a raised step or threshold.  Trim any bushes or trees on the path to your home.  Use bright outdoor lighting.  Clear any walking paths of anything that might make someone trip, such as rocks or tools.  Regularly check to see if handrails are loose or broken. Make sure that both sides of any steps have handrails.  Any raised decks and porches should have guardrails on the edges.  Have any leaves, snow, or ice cleared regularly.  Use sand or salt on walking paths during winter.  Clean up any spills in your garage right away. This includes oil or grease spills. What can I do in the bathroom?  Use night lights.  Install grab bars by the toilet and in the tub and shower. Do not use towel bars as grab bars.  Use non-skid mats or decals in the tub or shower.  If you need to sit down in the shower, use a plastic, non-slip stool.  Keep the floor dry. Clean up any water that spills on the floor as soon as it happens.  Remove soap buildup in the tub or shower regularly.  Attach bath mats securely with double-sided non-slip rug  tape.  Do not have throw rugs and other things on the floor that can make you trip. What can I do in the bedroom?  Use night lights.  Make sure that you have a light by your bed that is easy to reach.  Do not use any sheets or blankets that are too big for your bed. They should not hang down onto the floor.  Have a firm chair that has side arms. You can use this for support while you get dressed.  Do not have throw rugs and other things on the floor that can make you trip. What can I do in the kitchen?  Clean up any spills right away.  Avoid walking on wet floors.  Keep items that you use a lot in easy-to-reach places.  If you need to reach something above you, use a strong step stool that has a grab bar.  Keep electrical cords out of the way.  Do not use floor polish or wax that makes floors slippery. If you must use wax, use non-skid floor wax.  Do not have throw rugs and other things on the floor that can make you trip. What can I  do with my stairs?  Do not leave any items on the stairs.  Make sure that there are handrails on both sides of the stairs and use them. Fix handrails that are broken or loose. Make sure that handrails are as long as the stairways.  Check any carpeting to make sure that it is firmly attached to the stairs. Fix any carpet that is loose or worn.  Avoid having throw rugs at the top or bottom of the stairs. If you do have throw rugs, attach them to the floor with carpet tape.  Make sure that you have a light switch at the top of the stairs and the bottom of the stairs. If you do not have them, ask someone to add them for you. What else can I do to help prevent falls?  Wear shoes that:  Do not have high heels.  Have rubber bottoms.  Are comfortable and fit you well.  Are closed at the toe. Do not wear sandals.  If you use a stepladder:  Make sure that it is fully opened. Do not climb a closed stepladder.  Make sure that both sides of the  stepladder are locked into place.  Ask someone to hold it for you, if possible.  Clearly mark and make sure that you can see:  Any grab bars or handrails.  First and last steps.  Where the edge of each step is.  Use tools that help you move around (mobility aids) if they are needed. These include:  Canes.  Walkers.  Scooters.  Crutches.  Turn on the lights when you go into a dark area. Replace any light bulbs as soon as they burn out.  Set up your furniture so you have a clear path. Avoid moving your furniture around.  If any of your floors are uneven, fix them.  If there are any pets around you, be aware of where they are.  Review your medicines with your doctor. Some medicines can make you feel dizzy. This can increase your chance of falling. Ask your doctor what other things that you can do to help prevent falls. This information is not intended to replace advice given to you by your health care provider. Make sure you discuss any questions you have with your health care provider. Document Released: 10/30/2008 Document Revised: 06/11/2015 Document Reviewed: 02/07/2014 Elsevier Interactive Patient Education  2017 [employer].

## 2018-03-06 NOTE — Progress Notes (Signed)
Subjective:   Lance Carr is a 48 y.o. male who presents for an Initial Medicare Annual Wellness Visit.  Review of Systems  Cardiac Risk Factors include: family history of premature cardiovascular disease;male gender;obesity (BMI >30kg/m2)    Objective:    Today's Vitals   03/06/18 1043 03/06/18 1046  BP: (!) 148/88   Pulse: 66   Resp: 17   Temp: 97.7 F (36.5 C)   TempSrc: Oral   Weight: 289 lb 3.2 oz (131.2 kg)   Height: '5\' 9"'  (1.753 m)   PainSc:  8    Body mass index is 42.71 kg/m.  Advanced Directives 03/06/2018 04/26/2017  Does Patient Have a Medical Advance Directive? No No  Would patient like information on creating a medical advance directive? Yes (MAU/Ambulatory/Procedural Areas - Information given) No - Patient declined    Current Medications (verified) Outpatient Encounter Medications as of 03/06/2018  Medication Sig  . acetaminophen (TYLENOL) 325 MG tablet Take by mouth.  Marland Kitchen ibuprofen (ADVIL,MOTRIN) 200 MG tablet Take 600 mg by mouth every 6 (six) hours as needed for moderate pain.  . Menthol-Methyl Salicylate (GRX ANALGESIC BALM) OINT Apply topically.  . Oxycodone HCl 10 MG TABS 5 mg.   Marland Kitchen tiZANidine (ZANAFLEX) 4 MG tablet Take 4 mg by mouth 2 (two) times daily.  Marland Kitchen aspirin 81 MG chewable tablet Chew by mouth.  Marland Kitchen atorvastatin (LIPITOR) 80 MG tablet Take 1 tablet (80 mg total) by mouth daily. (Patient not taking: Reported on 03/06/2018)  . Blood Glucose Monitoring Suppl (TRUE METRIX METER) w/Device KIT Use to check blood sugar as needed for hypoglycemia up to x 1 daily (Patient not taking: Reported on 03/06/2018)  . Calcium Carbonate-Vit D-Min (GNP CALCIUM 1200) 1200-1000 MG-UNIT CHEW Chew 1,200 mg by mouth daily with breakfast. Take in combination with vitamin D and magnesium. (Patient not taking: Reported on 03/06/2018)  . DULoxetine (CYMBALTA) 20 MG capsule TAKE 2 CAPSULES BY MOUTH EVERY DAY (Patient not taking: Reported on 03/06/2018)  . gabapentin (NEURONTIN) 300  MG capsule Take 2 capsules (600 mg total) by mouth every 8 (eight) hours. Need appt (Patient not taking: Reported on 03/06/2018)  . Lancets (BD LANCET ULTRAFINE 30G) MISC Use to check blood sugar for hypoglycemia up to x 1 daily as needed (Patient not taking: Reported on 03/06/2018)  . Melatonin 3 MG TABS Take by mouth.  Marland Kitchen tiZANidine (ZANAFLEX) 2 MG tablet Take 6 mg by mouth at bedtime.  Suzan Nailer METRIX BLOOD GLUCOSE TEST test strip Check blood sugar as needed for hypoglycemia up to x1 daily (Patient not taking: Reported on 03/06/2018)  . verapamil (CALAN) 40 MG tablet Take 40 mg by mouth 3 (three) times daily.   No facility-administered encounter medications on file as of 03/06/2018.     Allergies (verified) Patient has no known allergies.   History: Past Medical History:  Diagnosis Date  . Anxiety   . Arthritis   . Chronic back pain   . Colon polyp   . Fibromyalgia   . Frequent headaches   . History of blood clots   . Hypoglycemia   . Sleep apnea    Past Surgical History:  Procedure Laterality Date  . GASTRIC BYPASS  10/2011  . MENISCUS REPAIR Left   . THYROID SURGERY Left   . VEIN SURGERY     varicose vein revisions   Family History  Problem Relation Age of Onset  . Heart disease Father   . Diabetes Father   . Varicose Veins Mother   .  Osteoarthritis Mother   . Healthy Maternal Grandmother   . Heart disease Maternal Grandfather   . Stroke Maternal Grandfather   . Colon cancer Paternal Uncle    Social History   Socioeconomic History  . Marital status: Married    Spouse name: Clint Bolder  . Number of children: Not on file  . Years of education: Not on file  . Highest education level: 11th grade  Occupational History  . Occupation: disability   Social Needs  . Financial resource strain: Not very hard  . Food insecurity:    Worry: Never true    Inability: Never true  . Transportation needs:    Medical: No    Non-medical: No  Tobacco Use  . Smoking status: Former  Smoker    Packs/day: 1.50    Years: 20.00    Pack years: 30.00    Types: Cigarettes    Last attempt to quit: 03/01/2005    Years since quitting: 13.0  . Smokeless tobacco: Never Used  Substance and Sexual Activity  . Alcohol use: No    Frequency: Never  . Drug use: No  . Sexual activity: Yes    Comment: partner surgical procedure  Lifestyle  . Physical activity:    Days per week: 0 days    Minutes per session: 0 min  . Stress: Only a little  Relationships  . Social connections:    Talks on phone: More than three times a week    Gets together: More than three times a week    Attends religious service: Never    Active member of club or organization: No    Attends meetings of clubs or organizations: Never    Relationship status: Married  Other Topics Concern  . Not on file  Social History Narrative  . Not on file   Tobacco Counseling Counseling given: Not Answered   Clinical Intake:  Pre-visit preparation completed: Yes  Pain : 0-10 Pain Score: 8  Pain Type: Chronic pain Pain Location: Generalized Pain Descriptors / Indicators: Aching Pain Onset: More than a month ago Pain Frequency: Constant Pain Relieving Factors: goes to pain clinic   Pain Relieving Factors: goes to pain clinic   Nutritional Status: BMI > 30  Obese Nutritional Risks: None Diabetes: No  How often do you need to have someone help you when you read instructions, pamphlets, or other written materials from your doctor or pharmacy?: 1 - Never What is the last grade level you completed in school?: 11th grade  Interpreter Needed?: No  Information entered by ::  ,LPN   Activities of Daily Living In your present state of health, do you have any difficulty performing the following activities: 03/06/2018  Hearing? N  Vision? Y  Comment left eye   Difficulty concentrating or making decisions? Y  Comment short term   Walking or climbing stairs? Y  Comment from past stroke   Dressing  or bathing? N  Doing errands, shopping? N  Preparing Food and eating ? N  Using the Toilet? N  In the past six months, have you accidently leaked urine? N  Do you have problems with loss of bowel control? N  Managing your Medications? N  Managing your Finances? N  Housekeeping or managing your Housekeeping? N  Some recent data might be hidden     Immunizations and Health Maintenance  There is no immunization history on file for this patient. Health Maintenance Due  Topic Date Due  . HIV Screening  07[birth year]06/1985  Patient Care Team: Mikey College, NP as PCP - General (Nurse Practitioner)  Indicate any recent Medical Services you may have received from other than [facility] providers in the past year (date may be approximate).    Assessment:   This is a routine wellness examination for [place].  Hearing/Vision screen  Visual Acuity Screening   Right eye Left eye Both eyes  Without correction: '20/70 20/70 20/50 '  With correction: '20/15 20/40 20/20 '    Dietary issues and exercise activities discussed: Current Exercise Habits: The patient does not participate in regular exercise at present, Exercise limited by: None identified  Goals   None    Depression Screen PHQ 2/9 Scores 03/06/2018 10/02/2017 04/26/2017 03/08/2017  PHQ - 2 Score 6 1 0 0  PHQ- 9 Score 16 8 - -    Patients PHQD score today was a 16, states he feels medication wont help. He declines treatment at this time. Most seems due to financial concerns. He is thinking about going back to [place], believes that is why he is down.  Denies any HI/SI. Appears in good spirits today while talking with him.    Fall Risk Fall Risk  03/06/2018 04/26/2017 03/08/2017  Falls in the past year? 1 No No  Number falls in past yr: 1 - -  Injury with Fall? 0 - -  Risk for fall due to : History of fall(s) - -    FALL RISK PREVENTION PERTAINING TO THE HOME:  Any stairs in or around the home? Yes  If so, are there any without  handrails? Yes   Home free of loose throw rugs in walkways, pet beds, electrical cords, etc? Yes  Adequate lighting in your home to reduce risk of falls? Yes   ASSISTIVE DEVICES UTILIZED TO PREVENT FALLS:  Life alert? No  Use of a cane, walker or w/c? Yes  cane when needed Grab bars in the bathroom? No  Shower chair or bench in shower? No  Elevated toilet seat or a handicapped toilet? No   DME ORDERS:  DME order needed?  No   TIMED UP AND GO:  Was the test performed? No .  Length of time to ambulate 10 feet: 11 sec.   GAIT:  Appearance of gait: Gait stead-fast without the use of an assistive device.  Education: Fall risk prevention has been discussed.  Intervention(s) required? No    Cognitive Function:     6CIT Screen 03/06/2018  What Year? 0 points  What month? 0 points  What time? 0 points  Count back from 20 0 points  Months in reverse 0 points  Repeat phrase 4 points  Total Score 4    Screening Tests Health Maintenance  Topic Date Due  . HIV Screening  07/06/1985  . INFLUENZA VACCINE  09/18/2018 (Originally 08/17/2017)  . TETANUS/TDAP  02/18/2023    Qualifies for Shingles Vaccine? Yes  Zostavax completed n/a. Due for Shingrix. Education has been provided regarding the importance of this vaccine. Pt has been advised to call insurance company to determine out of pocket expense. Advised may also receive vaccine at local pharmacy or Health Dept. Verbalized acceptance and understanding.  Tdap: states it was done in [place] in 2015  Flu Vaccine: Due for Flu vaccine. Does the patient want to receive this vaccine today?  No . Education has been provided regarding the importance of this vaccine but still declined. Advised may receive this vaccine at local pharmacy or Health Dept. Aware to provide a copy of  the vaccination record if obtained from local pharmacy or Health Dept. Verbalized acceptance and understanding.  Pneumococcal Vaccine: not indicated at this  time  Cancer Screenings:  Colorectal Screening: family hx of colon cancer, has had previous colonoscopy in [place] over 5 years ago. Declined screening at this time.   Lung Cancer Screening: (Low Dose CT Chest recommended if Age 30-80 years, 30 pack-year currently smoking OR have quit w/in 15years.) does not qualify.    Additional Screening:  Hepatitis C Screening: does not qualify  Vision Screening: Recommended annual ophthalmology exams for early detection of glaucoma and other disorders of the eye. Is the patient up to date with their annual eye exam?  No   If pt is not established with a provider, would they like to be referred to a provider to establish care? No .  Vision screening performed today at the office.   Dental Screening: Recommended annual dental exams for proper oral hygiene  Community Resource Referral:  CRR required this visit?  Yes    Referral placed for financial concerns, patient hasnt been able to obtain a glucose monitor, he has stopped seeing specialist due to financial difficulty as well.        Plan:     I have personally reviewed and addressed the Medicare Annual Wellness questionnaire and have noted the following in the patient's chart:  A. Medical and social history B. Use of alcohol, tobacco or illicit drugs  C. Current medications and supplements D. Functional ability and status E.  Nutritional status F.  Physical activity G. Advance directives H. List of other physicians I.  Hospitalizations, surgeries, and ER visits in previous 12 months J.  [place] such as hearing and vision if needed, cognitive and depression L. Referrals and appointments   In addition, I have reviewed and discussed with patient certain preventive protocols, quality metrics, and best practice recommendations. A written personalized care plan for preventive services as well as general preventive health recommendations were provided to  patient.   Signed,  Tyler Aas, LPN Nurse Health Advisor   Nurse Notes: stopped taking all meds except pain medications. States he doesn't have the money to go to specialist office. Appears the verapamil was prescribed by a different physician but the atorvastatin was prescribed by Lissa Merlin.  He also states he was unable to get blood glucose monitor due to insurance coverage. Placed a referral to connected care for financial concerns. Patient advised to schedule an appt with Lissa Merlin within the next week to get back on his medications.

## 2018-03-06 NOTE — Progress Notes (Signed)
We will address mediations and continue them from primary care for better continuity of care and prevention of CVA in future.  Appt scheduled for 03/15/2018

## 2018-03-12 DIAGNOSIS — G894 Chronic pain syndrome: Secondary | ICD-10-CM | POA: Diagnosis not present

## 2018-03-14 DIAGNOSIS — H43393 Other vitreous opacities, bilateral: Secondary | ICD-10-CM | POA: Diagnosis not present

## 2018-03-14 DIAGNOSIS — H40013 Open angle with borderline findings, low risk, bilateral: Secondary | ICD-10-CM | POA: Diagnosis not present

## 2018-03-14 DIAGNOSIS — H02051 Trichiasis without entropian right upper eyelid: Secondary | ICD-10-CM | POA: Diagnosis not present

## 2018-03-15 ENCOUNTER — Ambulatory Visit: Payer: Medicare Other | Admitting: Nurse Practitioner

## 2018-03-20 ENCOUNTER — Ambulatory Visit: Payer: Medicare Other | Admitting: Nurse Practitioner

## 2018-03-23 ENCOUNTER — Ambulatory Visit (INDEPENDENT_AMBULATORY_CARE_PROVIDER_SITE_OTHER): Payer: Medicare Other | Admitting: Nurse Practitioner

## 2018-03-23 ENCOUNTER — Other Ambulatory Visit: Payer: Self-pay

## 2018-03-23 ENCOUNTER — Encounter: Payer: Self-pay | Admitting: Nurse Practitioner

## 2018-03-23 VITALS — BP 134/90 | HR 70 | Temp 98.4°F | Ht 69.0 in | Wt 291.0 lb

## 2018-03-23 DIAGNOSIS — G8929 Other chronic pain: Secondary | ICD-10-CM

## 2018-03-23 DIAGNOSIS — F444 Conversion disorder with motor symptom or deficit: Secondary | ICD-10-CM | POA: Diagnosis not present

## 2018-03-23 DIAGNOSIS — E785 Hyperlipidemia, unspecified: Secondary | ICD-10-CM | POA: Diagnosis not present

## 2018-03-23 DIAGNOSIS — I2510 Atherosclerotic heart disease of native coronary artery without angina pectoris: Secondary | ICD-10-CM

## 2018-03-23 DIAGNOSIS — F4323 Adjustment disorder with mixed anxiety and depressed mood: Secondary | ICD-10-CM | POA: Diagnosis not present

## 2018-03-23 DIAGNOSIS — M5442 Lumbago with sciatica, left side: Secondary | ICD-10-CM | POA: Diagnosis not present

## 2018-03-23 DIAGNOSIS — G44211 Episodic tension-type headache, intractable: Secondary | ICD-10-CM | POA: Diagnosis not present

## 2018-03-23 DIAGNOSIS — I6930 Unspecified sequelae of cerebral infarction: Secondary | ICD-10-CM

## 2018-03-23 MED ORDER — ATORVASTATIN CALCIUM 80 MG PO TABS: 80.0000 mg | ORAL_TABLET | Freq: Every day | ORAL | 3 refills | Status: AC

## 2018-03-23 MED ORDER — DULOXETINE HCL 30 MG PO CPEP: 30.0000 mg | ORAL_CAPSULE | Freq: Every day | ORAL | 5 refills | Status: DC

## 2018-03-23 MED ORDER — VERAPAMIL HCL 40 MG PO TABS: 40.0000 mg | ORAL_TABLET | Freq: Three times a day (TID) | ORAL | 5 refills | Status: DC

## 2018-03-23 NOTE — Patient Instructions (Addendum)
Lance Carr,   Thank you for coming in to clinic today.  1. Resume your medications on your current list. - [facility] may be cheaper for medications than [facility].  RESUME verapamil 40 mg tablet three times daily for blood pressure and headache.  RESUME your duloxetine 30 mg one tablet once daily for depression and pain.  RESUME your atorvastatin 80 mg one tablet daily for heart attack and stroke prevention.  Please schedule a follow-up appointment with Lance Carr, AGNP. Return in about 3 months (around 06/23/2018) for hypertension.  If you have any other questions or concerns, please feel free to call the clinic or send a message through MyChart. You may also schedule an earlier appointment if necessary.  You will receive a survey after today's visit either digitally by e-mail or paper by [employer]. Your experiences and feedback matter to [place].  Please respond so we know how we are doing as we provide care for you.   Lance Mcardle, DNP, AGNP-BC Adult Gerontology Nurse Practitioner [facility], [facility]

## 2018-03-23 NOTE — Progress Notes (Signed)
Subjective:    Patient ID: Lance Carr, male    DOB: 1970/03/11, 48 y.o.   MRN: [id number]  Lance Carr is a 48 y.o. male presenting on 03/23/2018 for Depression (pt want a notice stating he needs to leave because of health benefits) and Hypertension   HPI Hypertension - He is not checking BP at home or outside of clinic.    - Current medications: none - he stopped all meds except pain meds due to insurance changes/cost - He is symptomatic with mild headache. - Pt denies lightheadedness, dizziness, changes in vision, chest tightness/pressure, palpitations, leg swelling, sudden loss of speech or loss of consciousness. - He  reports no regular exercise routine. - His diet is moderate in salt, moderate in fat, and moderate in carbohydrates.   Depression Patient notes increased depression secondary to lack of support to continue insurance coverage he feels is required.  Reports [employer] is requesting a large sum of money prior to covering any medications.  I am not aware of this program with medicare/medicaid. - Patient has also stopped his duloxetine.  He was not aware this medication was for depression and pain. - He is ready to return to [place] where he felt he had better social support from welfare resources and where he also has help from family. He has help from family here, but without the same perceived societal/social welfare benefits.   Depression screen [facility] 2/9 03/23/2018 03/06/2018 10/02/2017 04/26/2017 03/08/2017  Decreased Interest 3 3 0 0 0  Down, Depressed, Hopeless 3 3 1  0 0  PHQ - 2 Score 6 6 1  0 0  Altered sleeping 3 3 3  - -  Tired, decreased energy 3 3 1  - -  Change in appetite 3 0 0 - -  Feeling bad or failure about yourself  3 3 0 - -  Trouble concentrating 0 1 1 - -  Moving slowly or fidgety/restless 0 0 2 - -  Suicidal thoughts 0 0 0 - -  PHQ-9 Score 18 16 8  - -  Difficult doing work/chores Very difficult Very difficult Not difficult at all - -    Social History    Tobacco Use  . Smoking status: Former Smoker    Packs/day: 1.50    Years: 20.00    Pack years: 30.00    Types: Cigarettes    Last attempt to quit: 03/01/2005    Years since quitting: 13.0  . Smokeless tobacco: Never Used  Substance Use Topics  . Alcohol use: No    Frequency: Never  . Drug use: No    Review of Systems Per HPI unless specifically indicated above     Objective:    BP 134/90 (BP Location: Left Arm, Patient Position: Sitting, Cuff Size: Large)   Pulse 70   Temp 98.4 F (36.9 C) (Oral)   Ht 5\' 9"  (1.753 m)   Wt 291 lb (132 kg)   SpO2 98%   BMI 42.97 kg/m   Wt Readings from Last 3 Encounters:  03/23/18 291 lb (132 kg)  03/06/18 289 lb 3.2 oz (131.2 kg)  10/02/17 289 lb (131.1 kg)    Physical Exam Vitals signs reviewed.  Constitutional:      General: He is awake. He is not in acute distress.    Appearance: He is well-developed.  HENT:     Head: Normocephalic and atraumatic.     Right Ear: Tympanic membrane, ear canal and external ear normal.     Left Ear: Tympanic membrane,  ear canal and external ear normal.     Nose: Nose normal.     Mouth/Throat:     Mouth: Mucous membranes are moist.     Pharynx: Oropharynx is clear.  Eyes:     Conjunctiva/sclera: Conjunctivae normal.     Pupils: Pupils are equal, round, and reactive to light.  Neck:     Musculoskeletal: Normal range of motion and neck supple.     Vascular: No carotid bruit.  Cardiovascular:     Rate and Rhythm: Normal rate and regular rhythm.     Pulses:          Radial pulses are 2+ on the right side and 2+ on the left side.       Posterior tibial pulses are 1+ on the right side and 1+ on the left side.     Heart sounds: Normal heart sounds, S1 normal and S2 normal.  Pulmonary:     Effort: Pulmonary effort is normal. No respiratory distress.     Breath sounds: Normal breath sounds and air entry.  Skin:    General: Skin is warm and dry.  Neurological:     Mental Status: He is alert and  oriented to person, place, and time. Mental status is at baseline.     Gait: Gait abnormal (antalgic - uses cane (at baseline)).  Psychiatric:        Attention and Perception: Attention normal.        Mood and Affect: Affect normal. Mood is depressed.        Behavior: Behavior is withdrawn. Behavior is cooperative.        Thought Content: Thought content normal.        Judgment: Judgment normal.      Results for orders placed or performed during the hospital encounter of 03/08/17  VITAMIN D 25 Hydroxy (Vit-D Deficiency, Fractures)  Result Value Ref Range   Vit D, 25-Hydroxy 22.4 (L) 30.0 - 100.0 ng/mL  Sedimentation rate  Result Value Ref Range   Sed Rate 8 0 - 15 mm/hr  Magnesium  Result Value Ref Range   Magnesium 2.1 1.7 - 2.4 mg/dL  Comprehensive metabolic panel  Result Value Ref Range   Sodium 137 135 - 145 mmol/L   Potassium 3.8 3.5 - 5.1 mmol/L   Chloride 104 101 - 111 mmol/L   CO2 26 22 - 32 mmol/L   Glucose, Bld 98 65 - 99 mg/dL   BUN 12 6 - 20 mg/dL   Creatinine, Ser 1.61 0.61 - 1.24 mg/dL   Calcium 8.9 8.9 - 09.6 mg/dL   Total Protein 7.7 6.5 - 8.1 g/dL   Albumin 4.3 3.5 - 5.0 g/dL   AST 20 15 - 41 U/L   ALT 15 (L) 17 - 63 U/L   Alkaline Phosphatase 52 38 - 126 U/L   Total Bilirubin 0.9 0.3 - 1.2 mg/dL   GFR calc non Af Amer >60 >60 mL/min   GFR calc Af Amer >60 >60 mL/min   Anion gap 7 5 - 15      Assessment & Plan:   Problem List Items Addressed This Visit      Nervous and Auditory   Chronic low back pain (Primary Area of Pain) (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  Continues management with [facility] pain management clinic.   - Encouraged patient to resume duloxetine. 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  Continues to need duloxetine, but is not currently taking.  Resume.  Follow-up prn.   Relevant Medications   DULoxetine (CYMBALTA) 30 MG capsule   Headache, tension type, episodic Continues with regular headache.  Patient not taking verapamil as recommended by his neurologist in past for headache control.  Patient was unaware this medication was being used to treat both headache and hypertension.   1. Resume verapamil 40 mg tid. 2. Monitor with headache diary if needed for future changes to treatment. 3. Follow-up prn 3 months.   Relevant Medications   oxyCODONE (OXY IR/ROXICODONE) 5 MG immediate release tablet   verapamil (CALAN) 40 MG tablet   DULoxetine (CYMBALTA) 30 MG capsule     Other   Adjustment reaction with anxiety and depression - Primary Patient with moderate anxiety and depression, worsening off duloxetine.  Patient also has decreased social support and decreased welfare support this year which has changed financial stressors.  Patient continues to be unable to work and is on SSI disability with medicare/medicaid.  Plan: 1. RESUME duloxetine 30 mg daily. 2. Encouraged patient to continue good family support. 3. Referred patient to [facility] community social worker for assistance with verifying insurance coverage/medication coverage. 4. Follow-up 6-12 weeks prn.   Relevant Medications   DULoxetine (CYMBALTA) 30 MG capsule    Other Visit Diagnoses    Dyslipidemia     Patient also off atorvastatin.  Recommended highly to prevent future MI/CVA.  Patient again was not aware of what atorvastatin purpose was.  Resume med.  Refills sent.  Follow-up 3 months for labs.   Relevant Medications   atorvastatin (LIPITOR) 80 MG tablet   ASCVD (arteriosclerotic cardiovascular disease)       Relevant Medications   atorvastatin (LIPITOR) 80 MG tablet   verapamil (CALAN) 40 MG tablet   History of stroke with residual effects        Relevant Medications   atorvastatin (LIPITOR) 80 MG tablet      Meds ordered this encounter  Medications  . atorvastatin (LIPITOR) 80 MG tablet    Sig: Take 1 tablet (80 mg total) by mouth daily.    Dispense:  90 tablet    Refill:  3    Order Specific Question:   Supervising Provider    Answer:   Smitty Cords [[id number]]  . verapamil (CALAN) 40 MG tablet    Sig: Take 1 tablet (40 mg total) by mouth 3 (three) times daily.    Dispense:  90 tablet    Refill:  5    Order Specific Question:   Supervising Provider    Answer:   Smitty Cords [[id number]]  . DULoxetine (CYMBALTA) 30 MG capsule    Sig: Take 1 capsule (30 mg total) by mouth daily.    Dispense:  30 capsule    Refill:  5    M54.42, G89.29    Order Specific Question:   Supervising Provider    Answer:   Smitty Cords [[id number]]    Follow up plan: Return in about 3 months (around 06/23/2018) for hypertension.  Wilhelmina Mcardle, DNP, AGPCNP-BC Adult Gerontology Primary Care Nurse Practitioner [facility] 03/23/2018, 11:10 AM

## 2018-03-28 ENCOUNTER — Telehealth: Payer: Self-pay

## 2018-03-30 ENCOUNTER — Encounter: Payer: Self-pay | Admitting: Nurse Practitioner

## 2018-04-12 ENCOUNTER — Telehealth: Payer: Self-pay | Admitting: Nurse Practitioner

## 2018-04-12 DIAGNOSIS — R7309 Other abnormal glucose: Secondary | ICD-10-CM

## 2018-04-12 MED ORDER — GLUCOSE BLOOD VI STRP: ORAL_STRIP | 12 refills | Status: DC

## 2018-04-12 MED ORDER — ONETOUCH VERIO FLEX SYSTEM W/DEVICE KIT: 1.0000 | PACK | Freq: Every day | 0 refills | Status: AC

## 2018-04-12 MED ORDER — ONETOUCH VERIO FLEX SYSTEM W/DEVICE KIT: 1.0000 | PACK | Freq: Every day | 0 refills | Status: DC

## 2018-04-12 NOTE — Telephone Encounter (Signed)
° ° ° ° °  Spoke with pt regarding need for discounted or free blood glucose meter he stated his insurance was not covering the cost. I found an option through [employer] for him for a free blood glucose meter and test strips. I have emailed the voucher to him but he will need a new Rx from Lance Carr to match up with the [employer]. Discussed Medicare Extra help to assist with co-pays for his Neurology appts and Heart MD, sent info for applying and he stated he was comfortable filling out the online form I provided my contact info in case he had questions.  Lance Carr,  Will you please add a new prescription for Lance Carr for the OneTouch Verio Flex meter and strips  here is a picture of the voucher for your reference. ([facility] in [place])    Thank you!  Lance Carr  Care Guide - [facility]   Connected Care  ??[email or web address]  ?? [phone]   Skype

## 2018-04-15 ENCOUNTER — Other Ambulatory Visit: Payer: Self-pay | Admitting: Nurse Practitioner

## 2018-04-15 DIAGNOSIS — M5442 Lumbago with sciatica, left side: Principal | ICD-10-CM

## 2018-04-15 DIAGNOSIS — G8929 Other chronic pain: Secondary | ICD-10-CM

## 2018-04-15 DIAGNOSIS — F444 Conversion disorder with motor symptom or deficit: Secondary | ICD-10-CM

## 2018-04-15 DIAGNOSIS — F4323 Adjustment disorder with mixed anxiety and depressed mood: Secondary | ICD-10-CM

## 2018-04-16 ENCOUNTER — Other Ambulatory Visit: Payer: Self-pay

## 2018-04-16 DIAGNOSIS — R7309 Other abnormal glucose: Secondary | ICD-10-CM

## 2018-04-16 MED ORDER — GLUCOSE BLOOD VI STRP: ORAL_STRIP | 12 refills | Status: DC

## 2018-04-19 ENCOUNTER — Ambulatory Visit: Payer: Self-pay | Admitting: Licensed Clinical Social Worker

## 2018-04-19 ENCOUNTER — Telehealth: Payer: Self-pay

## 2018-04-19 NOTE — Chronic Care Management (AMB) (Signed)
   Chronic Care Management    Clinical Social Work General Note  04/19/2018 Name: Lance Carr MRN: [id number] DOB: 1970/06/06  Lance Carr is a 48 y.o. year old male who is a primary care patient of Galen Manila, NP. The CCM was consulted to assist the patient with [facility].   Review of patient status, including review of consultants reports, relevant laboratory and other test results, and collaboration with appropriate care team members and the patient's provider was performed as part of comprehensive patient evaluation and provision of chronic care management services.    Follow Up Plan: SW will follow up with patient by phone over the next 14 days      Dickie La, BSW, MSW, LCSW [facility] Care Management [place]  [facility] [email or web address] Phone: [phone]

## 2018-04-19 NOTE — Progress Notes (Signed)
I have reviewed this encounter including the documentation in this note and/or discussed this patient with the provider. I am certifying that I agree with the content of this note as patient's primary care provider.   Wilhelmina Mcardle, DNP, AGPCNP-BC Adult Gerontology Primary Care Nurse Practitioner [facility]  04/19/2018

## 2018-04-23 ENCOUNTER — Telehealth: Payer: Self-pay | Admitting: Nurse Practitioner

## 2018-04-23 ENCOUNTER — Other Ambulatory Visit: Payer: Self-pay | Admitting: Nurse Practitioner

## 2018-04-23 DIAGNOSIS — R7309 Other abnormal glucose: Secondary | ICD-10-CM

## 2018-04-23 MED ORDER — GLUCOSE BLOOD VI STRP: ORAL_STRIP | 12 refills | Status: AC

## 2018-04-23 NOTE — Telephone Encounter (Signed)
It is possible that this will not be covered for Mr. Tande.  He had normal A1c < 5.0 at last check [facility] labs).  I have used elevated glucose as dx code, but I cannot use prediabetes or diabetes.  I'm not sure if he will have this covered by insurance or dx code for coupon/voucher.  What is perplexing is the fact that the meter was provided with the same code.   I have resubmitted the order.

## 2018-04-23 NOTE — Telephone Encounter (Signed)
Lauren, See email below from pt. Can you please double ck the Rx for the strips for the OneTouch glucose blood (ONETOUCH VERIO) test strip. The Pharmacy msg'd you I believe.  Thanks! Samara Deist  The best # to reach him is [phone]   From: [email or web address]>  Sent: Sunday, April 22, 2018 10:21 AM To: Manuela Schwartz [facility]) [email or web address]> Subject: [External Email]Re: Community Resources for Blood Glucose Meter and Medicare Next Help  *Caution - External email - see footer for warnings* Hello Dina Rich you are well. I wanted to let you know that I did get the glucose machine but I was denied for the strips because of the diagnose the Dr. gave. They said they emailed Dr. Kyung Rudd to get another diagnose buy no answer yet. Should I call the office?

## 2018-06-10 DIAGNOSIS — G8929 Other chronic pain: Secondary | ICD-10-CM | POA: Diagnosis not present

## 2018-06-10 DIAGNOSIS — R471 Dysarthria and anarthria: Secondary | ICD-10-CM | POA: Diagnosis not present

## 2018-06-10 DIAGNOSIS — Z79899 Other long term (current) drug therapy: Secondary | ICD-10-CM | POA: Diagnosis not present

## 2018-06-10 DIAGNOSIS — R05 Cough: Secondary | ICD-10-CM | POA: Diagnosis not present

## 2018-06-10 DIAGNOSIS — Z79891 Long term (current) use of opiate analgesic: Secondary | ICD-10-CM | POA: Diagnosis not present

## 2018-06-10 DIAGNOSIS — I10 Essential (primary) hypertension: Secondary | ICD-10-CM | POA: Diagnosis not present

## 2018-06-10 DIAGNOSIS — R51 Headache: Secondary | ICD-10-CM | POA: Diagnosis not present

## 2018-06-10 DIAGNOSIS — Z7982 Long term (current) use of aspirin: Secondary | ICD-10-CM | POA: Diagnosis not present

## 2018-06-10 DIAGNOSIS — R4781 Slurred speech: Secondary | ICD-10-CM | POA: Diagnosis not present

## 2018-06-10 DIAGNOSIS — Z86718 Personal history of other venous thrombosis and embolism: Secondary | ICD-10-CM | POA: Diagnosis not present

## 2018-06-10 DIAGNOSIS — R29810 Facial weakness: Secondary | ICD-10-CM | POA: Diagnosis not present

## 2018-06-10 DIAGNOSIS — Z87891 Personal history of nicotine dependence: Secondary | ICD-10-CM | POA: Diagnosis not present

## 2018-06-10 DIAGNOSIS — R4182 Altered mental status, unspecified: Secondary | ICD-10-CM | POA: Diagnosis not present

## 2018-06-10 DIAGNOSIS — R918 Other nonspecific abnormal finding of lung field: Secondary | ICD-10-CM | POA: Diagnosis not present

## 2018-06-10 DIAGNOSIS — Z20828 Contact with and (suspected) exposure to other viral communicable diseases: Secondary | ICD-10-CM | POA: Diagnosis not present

## 2018-06-19 DIAGNOSIS — M940 Chondrocostal junction syndrome [Tietze]: Secondary | ICD-10-CM | POA: Diagnosis not present

## 2018-06-19 DIAGNOSIS — J3089 Other allergic rhinitis: Secondary | ICD-10-CM | POA: Diagnosis not present

## 2018-06-26 ENCOUNTER — Ambulatory Visit: Payer: Medicare Other | Admitting: Nurse Practitioner

## 2018-06-27 ENCOUNTER — Other Ambulatory Visit: Payer: Self-pay

## 2018-06-27 ENCOUNTER — Ambulatory Visit: Payer: Self-pay | Admitting: Licensed Clinical Social Worker

## 2018-06-27 ENCOUNTER — Ambulatory Visit (INDEPENDENT_AMBULATORY_CARE_PROVIDER_SITE_OTHER): Payer: Medicare Other | Admitting: Family Medicine

## 2018-06-27 ENCOUNTER — Encounter: Payer: Self-pay | Admitting: Family Medicine

## 2018-06-27 VITALS — BP 131/84 | HR 101 | Temp 98.3°F | Resp 16 | Ht 69.0 in | Wt 290.0 lb

## 2018-06-27 DIAGNOSIS — I10 Essential (primary) hypertension: Secondary | ICD-10-CM

## 2018-06-27 DIAGNOSIS — R0781 Pleurodynia: Secondary | ICD-10-CM

## 2018-06-27 DIAGNOSIS — G894 Chronic pain syndrome: Secondary | ICD-10-CM

## 2018-06-27 DIAGNOSIS — F444 Conversion disorder with motor symptom or deficit: Secondary | ICD-10-CM

## 2018-06-27 DIAGNOSIS — R002 Palpitations: Secondary | ICD-10-CM

## 2018-06-27 MED ORDER — VERAPAMIL HCL 80 MG PO TABS: 80.0000 mg | ORAL_TABLET | Freq: Three times a day (TID) | ORAL | 2 refills | Status: DC

## 2018-06-27 NOTE — Chronic Care Management (AMB) (Signed)
  Care Management Note   Lance Carr is a 48 y.o. year old male who is a primary care patient of Mikey College, NP. The CM team was consulted for assistance with chronic disease management and care coordination.   I reached out to [facility] by phone today. LCSW completed CCM outreach attempt today but was unable to reach patient successfully. A HIPPA compliant voice message was left encouraging patient to return call once available. LCSW rescheduled CCM SW appointment as well.  Review of patient status, including review of consultants reports, relevant laboratory and other test results, and collaboration with appropriate care team members and the patient's provider was performed as part of comprehensive patient evaluation and provision of chronic care management services.   Follow Up Plan: The patient has been provided with contact information for the care management team and has been advised to call with any health related questions or concerns.  The care management team is available to follow up with the patient after provider conversation with the patient regarding recommendation for care management engagement and subsequent re-referral to the care management team.   Eula Fried, BSW, MSW, [email or web address] Phone: [phone]

## 2018-06-27 NOTE — Progress Notes (Signed)
Subjective:    Patient ID: Lance Carr, male    DOB: Nov 22, 1970, 48 y.o.   MRN: [id number]  Lance Carr is a 48 y.o. male presenting on 06/27/2018 for Hospitalization Follow-up (rib pain, heart flutter)  PCP is Wilhelmina McardleLauren Kennedy, AGPCNP-BC - I am currently covering during her maternity leave.   HPI   ED FOLLOW-UP VISIT  [facility] Date of ED Visit: 06/10/18  Reason for Presenting to ED: Acute L sided weakness numbness Primary (+Secondary) Diagnosis: conversion disorder weakness, anxiety, developing CAP  FOLLOW-UP  - ED provider note and record have been reviewed - Patient presents today about 2.5 weeks after recent ED visit. Brief summary of recent course, patient had symptoms of R sided facial droop dysarthria weakness, presented to ED, testing in ED with ruled out CVA on imaging and exam, treated for developing CAP pneumonia with Azithromycin seen on X-ray opacity, also had rib pain among other constellation of symptoms, thought to have conversion disorder . - Today reports overall has had some improvement overall in his coughing. He describes two episodes of a "rib clicking" with pain one on R side and one on L side, due to cough, cause pain with movement no and to the touch on both sides still, slightly improving. He also endorses heart fluttering says for past 2 months happens 2-3 times a day, he is on verapamil for BP in past and admits higher heart rate  - New medications on discharge: Azithromycin  Denies weakness numbness tingling slurred speech, productive cough fever chills, headache syncope   [facility] 2/9 06/27/2018 03/23/2018 03/06/2018  Decreased Interest 1 3 3   Down, Depressed, Hopeless 2 3 3   PHQ - 2 Score 3 6 6   Altered sleeping 3 3 3   Tired, decreased energy 3 3 3   Change in appetite 2 3 0  Feeling bad or failure about yourself  3 3 3   Trouble concentrating 2 0 1  Moving slowly or fidgety/restless 1 0 0  Suicidal thoughts 0 0 0  PHQ-9  Score 17 18 16   Difficult doing work/chores Somewhat difficult Very difficult Very difficult    Social History   Tobacco Use  . Smoking status: Former Smoker    Packs/day: 1.50    Years: 20.00    Pack years: 30.00    Types: Cigarettes    Last attempt to quit: 03/01/2005    Years since quitting: 13.3  . Smokeless tobacco: Never Used  Substance Use Topics  . Alcohol use: Yes    Frequency: Never  . Drug use: No    Review of Systems Per HPI unless specifically indicated above     Objective:    BP 131/84   Pulse (!) 101   Temp 98.3 F (36.8 C) (Oral)   Resp 16   Ht 5\' 9"  (1.753 m)   Wt 290 lb (131.5 kg)   BMI 42.83 kg/m   Wt Readings from Last 3 Encounters:  06/27/18 290 lb (131.5 kg)  03/23/18 291 lb (132 kg)  03/06/18 289 lb 3.2 oz (131.2 kg)    Physical Exam Vitals signs and nursing note reviewed.  Constitutional:      General: He is not in acute distress.    Appearance: He is well-developed. He is not diaphoretic.     Comments: Well-appearing, comfortable, cooperative, obese  HENT:     Head: Normocephalic and atraumatic.  Eyes:     General:        Right eye: No discharge.  Left eye: No discharge.     Conjunctiva/sclera: Conjunctivae normal.  Neck:     Musculoskeletal: Normal range of motion and neck supple.     Thyroid: No thyromegaly.  Cardiovascular:     Rate and Rhythm: Normal rate and regular rhythm.     Heart sounds: Normal heart sounds. No murmur.  Pulmonary:     Effort: Pulmonary effort is normal. No respiratory distress.     Breath sounds: Normal breath sounds. No wheezing or rales.     Comments: Good air movement Musculoskeletal: Normal range of motion.     Comments: Reproduced pain on palpation over bilateral lateral trunk flank area on deeper palpation over rib angle  Lymphadenopathy:     Cervical: No cervical adenopathy.  Skin:    General: Skin is warm and dry.     Findings: No erythema or rash.  Neurological:     Mental Status:  He is alert and oriented to person, place, and time.  Psychiatric:        Behavior: Behavior normal.     Comments: Well groomed, good eye contact, normal speech and thoughts. Anxious appearing today      Interface, Rad Results In - 06/10/2018 11:08 PM EDT  EXAM: Computed tomography, head or brain without contrast material. DATE: 06/10/2018 10:50 PM ACCESSION: [id number] UN DICTATED: 06/10/2018 10:53 PM INTERPRETATION LOCATION: [facility]  CLINICAL INDICATION: 48 years old Male with OTHER   COMPARISON: 09/19/2017  TECHNIQUE: Axial CT images of the head from skull base to vertex without contrast.  FINDINGS:  There is no midline shift. No mass lesion. There is no evidence of acute infarct. No acute intracranial hemorrhage. No fractures are evident. Mild mucosal thickening and atelectasis of the right maxillary sinus. Right ethmoid sinus osteoma.  IMPRESSION: No acute intracranial abnormality.    CT Head Wo Contrast (06/10/2018 10:50 PM EDT)  Performing Organization Address City/State/Zipcode Phone Number  [facility]       [facility]  [address]  [place], [place] [phone]     Back to top of Imaging Results    XR Chest 1 view Portable (06/10/2018 10:49 PM EDT) XR Chest 1 view Portable (06/10/2018 10:49 PM EDT)  Specimen     XR Chest 1 view Portable (06/10/2018 10:49 PM EDT)  Impressions Performed At    Left basilar opacities, likely atelectasis and accentuated by low lung volumes.    [facility]          [facility]    XR Chest 1 view Portable (06/10/2018 10:49 PM EDT)  Procedure Note  Interface, Rad Results In - 06/11/2018 8:15 AM EDT  EXAM: XR CHEST PORTABLE DATE: 06/10/2018 10:49 PM ACCESSION: [id number] UN DICTATED: 06/10/2018 11:00 PM INTERPRETATION LOCATION: [facility]  CLINICAL INDICATION: 48 years old Male with ALTERED MENTAL STATUS   COMPARISON: 08/07/2017  TECHNIQUE: Portable Chest Radiograph.  FINDINGS:   Low lung volumes with patchy left basilar  opacities.  No pleural effusion or pneumothorax  Unremarkable cardiomediastinal silhouette.    IMPRESSION:  Left basilar opacities, likely atelectasis and accentuated by low lung volumes.    Results for orders placed or performed during the hospital encounter of 03/08/17  VITAMIN D 25 Hydroxy (Vit-D Deficiency, Fractures)  Result Value Ref Range   Vit D, 25-Hydroxy 22.4 (L) 30.0 - 100.0 ng/mL  Sedimentation rate  Result Value Ref Range   Sed Rate 8 0 - 15 mm/hr  Magnesium  Result Value Ref Range   Magnesium 2.1 1.7 - 2.4 mg/dL  Comprehensive metabolic panel  Result Value Ref Range   Sodium 137 135 - 145 mmol/L   Potassium 3.8 3.5 - 5.1 mmol/L   Chloride 104 101 - 111 mmol/L   CO2 26 22 - 32 mmol/L   Glucose, Bld 98 65 - 99 mg/dL   BUN 12 6 - 20 mg/dL   Creatinine, Ser 0.80 0.61 - 1.24 mg/dL   Calcium 8.9 8.9 - 10.3 mg/dL   Total Protein 7.7 6.5 - 8.1 g/dL   Albumin 4.3 3.5 - 5.0 g/dL   AST 20 15 - 41 U/L   ALT 15 (L) 17 - 63 U/L   Alkaline Phosphatase 52 38 - 126 U/L   Total Bilirubin 0.9 0.3 - 1.2 mg/dL   GFR calc non Af Amer >60 >60 mL/min   GFR calc Af Amer >60 >60 mL/min   Anion gap 7 5 - 15      Assessment & Plan:   Problem List Items Addressed This Visit    Chronic pain syndrome (Chronic) Rib pain on right side     Possible CAP, L basilar opacity - RESOLVED clinically  Reassurance Continues with chronic pain management, take medicines as prescribed PRN Likely secondary to rib strain bilateral from coughing based on history and exam Less likely fracture but possible    Relevant Medications   aspirin 81 MG chewable tablet   Hypertension - Primary Heart palpitations     Elevated HR, concerns based on episodic heart fluttering, some elevated BP  Trial at higher dose Verapamil 80mg  TID, as opposed to 40mg  TID previously, can titrate up slowly with existing rx, sent new rx now Next step would be return to Cardiology if still fluttering or problem, he was  offered this today but he declined due to cost of specialist apt    Relevant Medications   aspirin 81 MG chewable tablet   verapamil (CALAN) 80 MG tablet             Meds ordered this encounter  Medications  . verapamil (CALAN) 80 MG tablet    Sig: Take 1 tablet (80 mg total) by mouth 3 (three) times daily.    Dispense:  90 tablet    Refill:  2    Increased dose from 40 up to 80    Follow up plan: Return in about 3 months (around 09/27/2018), or if symptoms worsen or fail to improve, for 3 month HTN, heart flutter.  [name], [facility] 06/27/2018, 2:52 PM

## 2018-06-27 NOTE — Patient Instructions (Addendum)
Thank you for coming to the office today.  Increase Verapamil from 40 up to 80mg  - take one 3 times a day  Can start with x 2 of the 40 in morning, and then regular dosing x 1 40mg  afternoon and evening, eventually work up to 80mg  3 times a day  Should help reduce heart flutter and control BP and heart rate.  Likely pain in ribs is from possible fracture or dislocation or sprained rib from coughing.  Please schedule a Follow-up Appointment to: Return in about 3 months (around 09/27/2018), or if symptoms worsen or fail to improve, for 3 month HTN, heart flutter.  If you have any other questions or concerns, please feel free to call the office or send a message through [place]. You may also schedule an earlier appointment if necessary.  Additionally, you may be receiving a survey about your experience at our office within a few days to 1 week by e-mail or mail. We value your feedback.  [name], DO [place]

## 2018-06-27 NOTE — Chronic Care Management (AMB) (Signed)
  Chronic Care Management    Clinical Social Work General Note  06/27/2018 Name: Lance Carr MRN: [id number] DOB: 14-Aug-1970  Lance Carr is a 48 y.o. year old male who is a primary care patient of Mikey College, NP. The CCM was consulted to assist the patient with managing his health care needs.  Lance Carr was given information about Chronic Care Management services today including:  1. CCM service includes personalized support from designated clinical staff supervised by his physician, including individualized plan of care and coordination with other care providers 2. 24/7 contact phone numbers for assistance for urgent and routine care needs. 3. Service will only be billed when office clinical staff spend 20 minutes or more in a month to coordinate care. 4. Only one practitioner may furnish and bill the service in a calendar month. 5. The patient may stop CCM services at any time (effective at the end of the month) by phone call to the office staff. 6. The patient will be responsible for cost sharing (co-pay) of up to 20% of the service fee (after annual deductible is met).  Patient agreed to services and verbal consent obtained.  Patient agreeable to CCM services and nursing and pharmacy involvement. LCSW will update CCM team.  Review of patient status, including review of consultants reports, relevant laboratory and other test results, and collaboration with appropriate care team members and the patient's provider was performed as part of comprehensive patient evaluation and provision of chronic care management services.    Follow Up Plan: Next PCP appointment scheduled for: this afternoon at 2:20 pm.       Eula Fried, BSW, MSW, [email or web address] Phone: [phone]

## 2018-07-02 ENCOUNTER — Telehealth: Payer: Self-pay

## 2018-07-05 ENCOUNTER — Ambulatory Visit: Payer: Self-pay | Admitting: *Deleted

## 2018-07-05 ENCOUNTER — Telehealth: Payer: Self-pay

## 2018-07-05 DIAGNOSIS — F444 Conversion disorder with motor symptom or deficit: Secondary | ICD-10-CM

## 2018-07-05 NOTE — Chronic Care Management (AMB) (Signed)
  Chronic Care Management   Outreach Note  07/05/2018 Name: Lance Carr MRN: [id number] DOB: 09-Apr-1970  Referred by: Mikey College, NP Reason for referral : Chronic Care Management (first unsuccessful outreach attempt)   An unsuccessful telephone outreach was attempted today. The patient was referred to the case management team by for assistance with chronic care management and care coordination.   Follow Up Plan: A HIPPA compliant phone message was left for the patient providing contact information and requesting a return call.  The care management team will reach out to the patient again over the next 30 days.   Merlene Morse Marlis Oldaker RN, BSN Nurse Case Pharmacist, [facility]  [phone]) Business Mobile

## 2018-07-11 ENCOUNTER — Ambulatory Visit: Payer: Medicare Other | Admitting: Pharmacist

## 2018-07-11 NOTE — Chronic Care Management (AMB) (Signed)
  Chronic Care Management   Follow Up Note   07/11/2018 Name: Lance Carr MRN: [id number] DOB: 1970/04/11  Referred by: Mikey College, NP Reason for referral : Chronic Care Management (Initial Patient Outreach)   Lance Carr is a 48 y.o. year old male who is a primary care patient of Mikey College, NP. The CCM team was consulted for assistance with chronic disease management and care coordination needs.    Receive referral from [facility] to reach out to patient to determine whether he has any CM pharmacy needs.  I reached out to Lance Carr by phone today. Lance Carr denies any medication questions or concerns at this time.   Plan  The patient has been provided with contact information for CM Pharmacist and has been advised to call with any medication related questions or concerns.   Harlow Asa, PharmD, [facility] [employer] [phone]

## 2018-07-16 ENCOUNTER — Telehealth: Payer: Self-pay

## 2018-07-16 DIAGNOSIS — M47817 Spondylosis without myelopathy or radiculopathy, lumbosacral region: Secondary | ICD-10-CM | POA: Diagnosis not present

## 2018-07-16 DIAGNOSIS — G894 Chronic pain syndrome: Secondary | ICD-10-CM | POA: Diagnosis not present

## 2018-07-16 DIAGNOSIS — M545 Low back pain: Secondary | ICD-10-CM | POA: Diagnosis not present

## 2018-07-16 DIAGNOSIS — R0781 Pleurodynia: Secondary | ICD-10-CM

## 2018-07-16 DIAGNOSIS — J189 Pneumonia, unspecified organism: Secondary | ICD-10-CM

## 2018-07-16 DIAGNOSIS — M47816 Spondylosis without myelopathy or radiculopathy, lumbar region: Secondary | ICD-10-CM | POA: Diagnosis not present

## 2018-07-16 NOTE — Telephone Encounter (Signed)
As of last visit with me on 06/27/18, he did not have any clinical sign of pneumonia anymore.  If it is requested by pain doctors, next step would be to offer a Chest X-ray.  I have already placed order for him to go to [place] - near [place]. He can do walk in for that, once reviewed we can notify him of result, and he can share with pain doctors.  Also I received a blank handicap placard form in my box today for this patient.  This is not something that we talked about. I would need to know why he is considered handicap for a placard.  Also if he has had one before or if this is first request.  If it is for significant orthopedic / back pain, then we can complete it for temporary 6 month placard.  [name], [facility] 07/16/2018, 5:25 PM

## 2018-07-16 NOTE — Telephone Encounter (Addendum)
Pt called complaining of rib pain. He went to see his pain management doctor and they recommended that he f/u with PCP to rule out that his recent diagnosis of pneumonia is resolved. Please advise

## 2018-07-16 NOTE — Addendum Note (Signed)
Addended by: Olin Hauser on: 07/16/2018 05:27 PM   Modules accepted: Orders

## 2018-07-17 ENCOUNTER — Ambulatory Visit
Admission: RE | Admit: 2018-07-17 | Discharge: 2018-07-17 | Disposition: A | Discharge status: Home / Self Care | Payer: Medicare Other | Source: Ambulatory Visit | Attending: Family Medicine | Admitting: Family Medicine

## 2018-07-17 ENCOUNTER — Ambulatory Visit
Admission: RE | Admit: 2018-07-17 | Discharge: 2018-07-17 | Disposition: A | Discharge status: Home / Self Care | Payer: Medicare Other | Attending: Family Medicine | Admitting: Family Medicine

## 2018-07-17 ENCOUNTER — Other Ambulatory Visit: Payer: Self-pay

## 2018-07-17 DIAGNOSIS — R0781 Pleurodynia: Secondary | ICD-10-CM | POA: Diagnosis not present

## 2018-07-17 DIAGNOSIS — J189 Pneumonia, unspecified organism: Secondary | ICD-10-CM | POA: Diagnosis not present

## 2018-07-17 DIAGNOSIS — J181 Lobar pneumonia, unspecified organism: Secondary | ICD-10-CM | POA: Diagnosis not present

## 2018-07-17 NOTE — Telephone Encounter (Signed)
The pt was notified of the providers recommendation. He informed me that the handicap placard is for his orthopedic/ back pain.  He verbalize understanding, no questions or concerns

## 2018-07-17 NOTE — Telephone Encounter (Signed)
Handicap form completed temporary 6 month, patient notified for pick up  [name], [facility] 07/17/2018, 12:47 PM

## 2018-07-18 ENCOUNTER — Other Ambulatory Visit: Payer: Self-pay | Admitting: Family Medicine

## 2018-07-18 DIAGNOSIS — J302 Other seasonal allergic rhinitis: Secondary | ICD-10-CM

## 2018-07-18 MED ORDER — LORATADINE 10 MG PO TABS: 10.0000 mg | ORAL_TABLET | Freq: Every day | ORAL | 1 refills | Status: DC

## 2018-08-06 ENCOUNTER — Telehealth: Payer: Self-pay

## 2018-08-13 ENCOUNTER — Telehealth: Payer: Self-pay

## 2018-08-16 ENCOUNTER — Telehealth: Payer: Self-pay

## 2018-08-16 ENCOUNTER — Ambulatory Visit: Payer: Self-pay | Admitting: *Deleted

## 2018-08-16 DIAGNOSIS — F444 Conversion disorder with motor symptom or deficit: Secondary | ICD-10-CM

## 2018-08-16 NOTE — Chronic Care Management (AMB) (Signed)
  Chronic Care Management   Outreach Note  08/16/2018 Name: Lance Carr MRN: [id number] DOB: Apr 19, 1970  Referred by: Mikey College, NP Reason for referral : Chronic Care Management (2nd Unsuccessful outreach)   A second unsuccessful telephone outreach was attempted today. The patient was referred to the case management team for assistance with chronic care management and care coordination.   Follow Up Plan: A HIPPA compliant phone message was left for the patient providing contact information and requesting a return call.  The care management team will reach out to the patient again over the next 30 days.     Merlene Morse Jamicah Anstead RN, BSN Nurse Case Pharmacist, [facility]  [phone]) Business [employer]

## 2018-09-10 ENCOUNTER — Telehealth: Payer: Self-pay

## 2018-09-10 DIAGNOSIS — R058 Other specified cough: Secondary | ICD-10-CM

## 2018-09-10 DIAGNOSIS — R071 Chest pain on breathing: Secondary | ICD-10-CM

## 2018-09-10 DIAGNOSIS — R05 Cough: Secondary | ICD-10-CM

## 2018-09-10 NOTE — Telephone Encounter (Signed)
After speaking with the patient he actually didn't feel like another xray was necessary at this time. He's requesting for something to help with his cough that the patient contributes to his allergies. I scheduled the patient for a virtual visit for tomorrow at 8:20am.

## 2018-09-10 NOTE — Telephone Encounter (Signed)
I would suggest we start with a Chest Xray and then schedule face to face if needed after that.  We will not do chest xray while here, so getting it prior to visit (virtual okay) would be best.  If Xray negative - face to face is appropriate and necessary.

## 2018-09-10 NOTE — Telephone Encounter (Signed)
The pt wife called to schedule a face to face visit to f/u on a pneumonia diagnosis from 6 weeks ago. She state the patient recently broke a rib and is complaining of some chest discomfort with coughing. He would like to come in the office to see if the pneumonia is resolved. Please advise  [phone]

## 2018-09-11 ENCOUNTER — Encounter: Payer: Self-pay | Admitting: Nurse Practitioner

## 2018-09-11 ENCOUNTER — Ambulatory Visit (INDEPENDENT_AMBULATORY_CARE_PROVIDER_SITE_OTHER): Payer: Medicare Other | Admitting: Nurse Practitioner

## 2018-09-11 ENCOUNTER — Other Ambulatory Visit: Payer: Self-pay

## 2018-09-11 DIAGNOSIS — R002 Palpitations: Secondary | ICD-10-CM

## 2018-09-11 DIAGNOSIS — J40 Bronchitis, not specified as acute or chronic: Secondary | ICD-10-CM

## 2018-09-11 MED ORDER — DM-GUAIFENESIN ER 30-600 MG PO TB12: 1.0000 | ORAL_TABLET | Freq: Two times a day (BID) | ORAL | Status: AC

## 2018-09-11 MED ORDER — ALBUTEROL SULFATE HFA 108 (90 BASE) MCG/ACT IN AERS: 1.0000 | INHALATION_SPRAY | Freq: Four times a day (QID) | RESPIRATORY_TRACT | 1 refills | Status: AC | PRN

## 2018-09-11 NOTE — Progress Notes (Signed)
Telemedicine Encounter: Disclosed to patient at start of encounter that we will provide appropriate telemedicine services.  Patient consents to be treated via phone prior to discussion. - Patient is at his home and is accessed via telephone. - Services are provided by Cassell Smiles from [facility].  Subjective:    Patient ID: Lance Carr, male    DOB: Jun 04, 1970, 48 y.o.   MRN: [id number]  Lance Carr is a 48 y.o. male presenting on 09/11/2018 for Cough (presistent cough w/ intermittent whitish phlegm )  HPI Cough Patient presents today for further evaluation of persistent cough with occasional clear/white production.  Patient cannot remember exact start - possibly two months or more. - Chart review reveals 06/10/2018 ER visit at [facility] for facial droop.  Patient was found to have pneumonia at that time and was treated with azithromycin.  Patient tested negative for Covid-19. - Approximately 3 weeks later, he was re-evaluated by Dr. Parks Ranger on 06/27/2018.  Chest Xray at that visit revealed resolved pneumonia and posterior LEFT 8th rib fracture.  Rib fracture occurred during coughing.  Patient admits he now feels that fracture is healed as it is not causing pain. At visit on 6/10, patient was provided instructions to start loratadine for seasonal allergies without significant improvement. Benzonatate was not helpful for cough - Patient has desire for cough to improve.  Notes no wheezing or shortness of breath, no fever/chills/sweats.  No other signs and symptoms of pneumonia at this time.  Patient does not desire repeat CXR today. - Patient also notes continues having pain under RIGHT side of chest due to constant coughing, pulled muscles have occurred.  Changes with movement, putting pressure on the painful location.    Palpitations Patient continues having heart fluttering on a daily basis (without pattern of activity/rest).  Patient notes it was previously intermittent and  not daily.  Patient cannot predict time of day that these will occur.  Desires additional workup to ID cause/what the palpitations rhythm is.  Social History   Tobacco Use   Smoking status: Former Smoker    Packs/day: 1.50    Years: 20.00    Pack years: 30.00    Types: Cigarettes    Quit date: 03/01/2005    Years since quitting: 13.5   Smokeless tobacco: Never Used  Substance Use Topics   Alcohol use: Yes    Frequency: Never   Drug use: No    Review of Systems Per HPI unless specifically indicated above     Objective:    There were no vitals taken for this visit.  Wt Readings from Last 3 Encounters:  06/27/18 290 lb (131.5 kg)  03/23/18 291 lb (132 kg)  03/06/18 289 lb 3.2 oz (131.2 kg)    Physical Exam Patient remotely monitored.  Verbal communication appropriate.  Cognition normal.   Results for orders placed or performed during the hospital encounter of 03/08/17  VITAMIN D 25 Hydroxy (Vit-D Deficiency, Fractures)  Result Value Ref Range   Vit D, 25-Hydroxy 22.4 (L) 30.0 - 100.0 ng/mL  Sedimentation rate  Result Value Ref Range   Sed Rate 8 0 - 15 mm/hr  Magnesium  Result Value Ref Range   Magnesium 2.1 1.7 - 2.4 mg/dL  Comprehensive metabolic panel  Result Value Ref Range   Sodium 137 135 - 145 mmol/L   Potassium 3.8 3.5 - 5.1 mmol/L   Chloride 104 101 - 111 mmol/L   CO2 26 22 - 32 mmol/L   Glucose, Bld  98 65 - 99 mg/dL   BUN 12 6 - 20 mg/dL   Creatinine, Ser 2.020.80 0.61 - 1.24 mg/dL   Calcium 8.9 8.9 - 54.210.3 mg/dL   Total Protein 7.7 6.5 - 8.1 g/dL   Albumin 4.3 3.5 - 5.0 g/dL   AST 20 15 - 41 U/L   ALT 15 (L) 17 - 63 U/L   Alkaline Phosphatase 52 38 - 126 U/L   Total Bilirubin 0.9 0.3 - 1.2 mg/dL   GFR calc non Af Amer >60 >60 mL/min   GFR calc Af Amer >60 >60 mL/min   Anion gap 7 5 - 15      Assessment & Plan:   Problem List Items Addressed This Visit    None    Visit Diagnoses    Bronchitis    -  Primary Persistent cough with minimal  production is likely due to bronchitis/postinfectious cough.    Plan: 1. Educated patient that this cough could last 3-4 months before resolution 2. START albuterol 1-2 puffs q6h prn wheezing or shortness of breath, coughing fits.  Reviewed coughing could worsen immediately after administration.  Also reviewed that palpitations could worsen immediately after taking, but should subside within 5-10 minutes.  Call clinic if not resolving. 3. START Mucinex DM or similar OTC cough medication 4. Follow-up 1-2 weeks for in person evaluation if not improving or if worsening.   Relevant Medications   albuterol (VENTOLIN HFA) 108 (90 Base) MCG/ACT inhaler   dextromethorphan-guaiFENesin (MUCINEX DM) 30-600 MG 12hr tablet   Palpitations     Persistent, daily experience of palpitations.  No prior evaluation for palpitations.  Also not likely to capture on EKG.   - Holter monitor ordered for 24 hour monitoring.   - Follow-up with cardiology prn after results or sooner if new cardiac symptoms arise.   Relevant Orders   Holter monitor - 24 hour      Meds ordered this encounter  Medications   albuterol (VENTOLIN HFA) 108 (90 Base) MCG/ACT inhaler    Sig: Inhale 1-2 puffs into the lungs every 6 (six) hours as needed for wheezing or shortness of breath (coughing).    Dispense:  8 g    Refill:  1    Product selection permitted for insurance/patient brand preference    Order Specific Question:   Supervising Provider    Answer:   Smitty CordsKARAMALEGOS, ALEXANDER J [[id number]]    - Time spent in direct consultation with patient via telemedicine about above concerns: 14 minutes  Follow up plan: Follow-up 1-2 weeks if worsening or not improving.  Wilhelmina McardleLauren Lashannon Bresnan, DNP, AGPCNP-BC Adult Gerontology Primary Care Nurse Practitioner [facility] 09/11/2018, 8:17 AM

## 2018-09-14 ENCOUNTER — Ambulatory Visit: Payer: Medicare Other | Attending: Nurse Practitioner

## 2018-09-23 ENCOUNTER — Other Ambulatory Visit: Payer: Self-pay | Admitting: Family Medicine

## 2018-09-23 DIAGNOSIS — I10 Essential (primary) hypertension: Secondary | ICD-10-CM

## 2018-09-23 DIAGNOSIS — R002 Palpitations: Secondary | ICD-10-CM

## 2018-10-09 ENCOUNTER — Other Ambulatory Visit: Payer: Self-pay | Admitting: Nurse Practitioner

## 2018-10-09 DIAGNOSIS — F4323 Adjustment disorder with mixed anxiety and depressed mood: Secondary | ICD-10-CM

## 2018-10-09 DIAGNOSIS — F444 Conversion disorder with motor symptom or deficit: Secondary | ICD-10-CM

## 2018-10-09 DIAGNOSIS — G8929 Other chronic pain: Secondary | ICD-10-CM

## 2018-10-15 DIAGNOSIS — Z0289 Encounter for other administrative examinations: Secondary | ICD-10-CM | POA: Diagnosis not present

## 2018-10-15 DIAGNOSIS — G894 Chronic pain syndrome: Secondary | ICD-10-CM | POA: Diagnosis not present

## 2018-10-15 DIAGNOSIS — M47817 Spondylosis without myelopathy or radiculopathy, lumbosacral region: Secondary | ICD-10-CM | POA: Diagnosis not present

## 2018-10-15 DIAGNOSIS — M47816 Spondylosis without myelopathy or radiculopathy, lumbar region: Secondary | ICD-10-CM | POA: Diagnosis not present

## 2018-10-15 DIAGNOSIS — G8929 Other chronic pain: Secondary | ICD-10-CM | POA: Diagnosis not present

## 2018-10-15 DIAGNOSIS — M545 Low back pain: Secondary | ICD-10-CM | POA: Diagnosis not present

## 2018-10-22 ENCOUNTER — Telehealth: Payer: Self-pay

## 2018-10-22 ENCOUNTER — Ambulatory Visit: Payer: Self-pay | Admitting: *Deleted

## 2018-10-22 DIAGNOSIS — I10 Essential (primary) hypertension: Secondary | ICD-10-CM

## 2018-10-22 NOTE — Chronic Care Management (AMB) (Signed)
  Chronic Care Management   Outreach Note  10/22/2018 Name: Lance Carr MRN: [id number] DOB: November 10, 1970  Referred by: Mikey College, NP Reason for referral : Chronic Care Management (Unsuccessful outreach x3)   Third unsuccessful telephone outreach was attempted today. The patient was referred to the case management team for assistance with care management and care coordination. The patient's primary care provider has been notified of our unsuccessful attempts to make or maintain contact with the patient. The care management team is pleased to engage with this patient at any time in the future should he/she be interested in assistance from the care management team.   Follow Up Plan: A HIPPA compliant phone message was left for the patient providing contact information and requesting a return call.  The care management team is available to follow up with the patient after provider conversation with the patient regarding recommendation for care management engagement and subsequent re-referral to the care management team.    Ryleeann Urquiza RN, BSN Nurse Case Manager [facility]  [phone]) Business [employer]

## 2018-11-09 DIAGNOSIS — F419 Anxiety disorder, unspecified: Secondary | ICD-10-CM | POA: Diagnosis not present

## 2018-11-16 NOTE — Progress Notes (Signed)
I have reviewed this encounter including the documentation in this note and/or discussed this patient with the provider. I am certifying that I agree with the content of this note as supervising physician.  [name], [facility] 11/16/2018, 11:11 PM

## 2018-11-29 ENCOUNTER — Ambulatory Visit: Payer: Medicare Other | Admitting: Physician Assistant

## 2018-12-05 ENCOUNTER — Other Ambulatory Visit: Payer: Self-pay

## 2018-12-05 ENCOUNTER — Ambulatory Visit: Payer: Medicare Other | Admitting: Family Medicine

## 2019-01-04 ENCOUNTER — Other Ambulatory Visit: Payer: Self-pay | Admitting: Family Medicine

## 2019-01-04 ENCOUNTER — Other Ambulatory Visit: Payer: Self-pay | Admitting: Nurse Practitioner

## 2019-01-04 DIAGNOSIS — R002 Palpitations: Secondary | ICD-10-CM

## 2019-01-04 DIAGNOSIS — J302 Other seasonal allergic rhinitis: Secondary | ICD-10-CM

## 2019-01-04 DIAGNOSIS — I10 Essential (primary) hypertension: Secondary | ICD-10-CM

## 2019-01-06 ENCOUNTER — Other Ambulatory Visit: Payer: Self-pay | Admitting: Nurse Practitioner

## 2019-01-06 DIAGNOSIS — F4323 Adjustment disorder with mixed anxiety and depressed mood: Secondary | ICD-10-CM

## 2019-01-06 DIAGNOSIS — G8929 Other chronic pain: Secondary | ICD-10-CM

## 2019-01-06 DIAGNOSIS — F444 Conversion disorder with motor symptom or deficit: Secondary | ICD-10-CM

## 2019-02-07 ENCOUNTER — Other Ambulatory Visit: Payer: Self-pay | Admitting: Family Medicine

## 2019-02-07 DIAGNOSIS — I10 Essential (primary) hypertension: Secondary | ICD-10-CM

## 2019-02-07 DIAGNOSIS — J302 Other seasonal allergic rhinitis: Secondary | ICD-10-CM

## 2019-02-07 DIAGNOSIS — R002 Palpitations: Secondary | ICD-10-CM

## 2019-02-07 NOTE — Telephone Encounter (Signed)
Requested medication (s) are due for refill today -yes  Requested medication (s) are on the active medication list -yes  Future visit scheduled - no  Last refill: 01/04/19  Notes to clinic: Patient is requesting refill of medication not on protocol  Requested Prescriptions  Pending Prescriptions Disp Refills   verapamil (CALAN) 80 MG tablet [Pharmacy Med Name: VERAPAMIL 80 MG TABLET] 90 tablet 0    Sig: TAKE 1 TABLET (80 MG TOTAL) BY MOUTH 3 (THREE) TIMES DAILY.      There is no refill protocol information for this order        Requested Prescriptions  Pending Prescriptions Disp Refills   verapamil (CALAN) 80 MG tablet [Pharmacy Med Name: VERAPAMIL 80 MG TABLET] 90 tablet 0    Sig: TAKE 1 TABLET (80 MG TOTAL) BY MOUTH 3 (THREE) TIMES DAILY.      There is no refill protocol information for this order

## 2019-02-07 NOTE — Telephone Encounter (Signed)
Your office's patient

## 2019-02-07 NOTE — Telephone Encounter (Signed)
Routing to provider  

## 2019-02-26 DIAGNOSIS — J301 Allergic rhinitis due to pollen: Secondary | ICD-10-CM | POA: Diagnosis not present

## 2019-02-26 DIAGNOSIS — I483 Typical atrial flutter: Secondary | ICD-10-CM | POA: Diagnosis not present

## 2019-02-26 DIAGNOSIS — Z8673 Personal history of transient ischemic attack (TIA), and cerebral infarction without residual deficits: Secondary | ICD-10-CM | POA: Diagnosis not present

## 2019-02-26 DIAGNOSIS — I10 Essential (primary) hypertension: Secondary | ICD-10-CM | POA: Diagnosis not present

## 2019-02-26 DIAGNOSIS — M25562 Pain in left knee: Secondary | ICD-10-CM | POA: Diagnosis not present

## 2019-02-26 DIAGNOSIS — Z1389 Encounter for screening for other disorder: Secondary | ICD-10-CM | POA: Diagnosis not present

## 2019-02-26 DIAGNOSIS — Z1331 Encounter for screening for depression: Secondary | ICD-10-CM | POA: Diagnosis not present

## 2019-02-26 DIAGNOSIS — Z2821 Immunization not carried out because of patient refusal: Secondary | ICD-10-CM | POA: Diagnosis not present

## 2019-02-26 DIAGNOSIS — G894 Chronic pain syndrome: Secondary | ICD-10-CM | POA: Diagnosis not present

## 2019-02-26 DIAGNOSIS — M797 Fibromyalgia: Secondary | ICD-10-CM | POA: Diagnosis not present

## 2019-03-02 ENCOUNTER — Other Ambulatory Visit: Payer: Self-pay | Admitting: Family Medicine

## 2019-03-02 DIAGNOSIS — J302 Other seasonal allergic rhinitis: Secondary | ICD-10-CM

## 2019-03-02 NOTE — Telephone Encounter (Signed)
Requested Prescriptions  Pending Prescriptions Disp Refills  . loratadine (CLARITIN) 10 MG tablet [Pharmacy Med Name: LORATADINE 10 MG TABLET] 90 tablet 0    Sig: TAKE 1 TABLET BY MOUTH DAILY. USE FOR 4-6 WEEKS THEN STOP, AND USE AS NEEDED OR SEASONALLY     There is no refill protocol information for this order

## 2019-03-04 ENCOUNTER — Telehealth: Payer: Self-pay | Admitting: Nurse Practitioner

## 2019-03-04 NOTE — Telephone Encounter (Signed)
I left a message asking the patient to call and schedule virtual AWV-S with Tiffany on 03/12/19 if possible. VDM (DD)

## 2019-03-05 DIAGNOSIS — M5416 Radiculopathy, lumbar region: Secondary | ICD-10-CM | POA: Diagnosis not present

## 2019-03-05 DIAGNOSIS — M5126 Other intervertebral disc displacement, lumbar region: Secondary | ICD-10-CM | POA: Diagnosis not present

## 2019-03-05 DIAGNOSIS — Z79899 Other long term (current) drug therapy: Secondary | ICD-10-CM | POA: Diagnosis not present

## 2019-03-05 DIAGNOSIS — M542 Cervicalgia: Secondary | ICD-10-CM | POA: Diagnosis not present

## 2019-03-05 DIAGNOSIS — Z5181 Encounter for therapeutic drug level monitoring: Secondary | ICD-10-CM | POA: Diagnosis not present

## 2019-03-05 DIAGNOSIS — G894 Chronic pain syndrome: Secondary | ICD-10-CM | POA: Diagnosis not present

## 2019-03-06 DIAGNOSIS — E662 Morbid (severe) obesity with alveolar hypoventilation: Secondary | ICD-10-CM | POA: Diagnosis not present

## 2019-03-06 DIAGNOSIS — R079 Chest pain, unspecified: Secondary | ICD-10-CM | POA: Diagnosis not present

## 2019-03-06 DIAGNOSIS — R0789 Other chest pain: Secondary | ICD-10-CM | POA: Diagnosis not present

## 2019-03-06 DIAGNOSIS — I110 Hypertensive heart disease with heart failure: Secondary | ICD-10-CM | POA: Diagnosis not present

## 2019-03-06 DIAGNOSIS — R002 Palpitations: Secondary | ICD-10-CM | POA: Diagnosis not present

## 2019-03-06 DIAGNOSIS — Z743 Need for continuous supervision: Secondary | ICD-10-CM | POA: Diagnosis not present

## 2019-03-06 DIAGNOSIS — I429 Cardiomyopathy, unspecified: Secondary | ICD-10-CM | POA: Diagnosis not present

## 2019-03-06 DIAGNOSIS — I5020 Unspecified systolic (congestive) heart failure: Secondary | ICD-10-CM | POA: Diagnosis not present

## 2019-03-06 DIAGNOSIS — J9611 Chronic respiratory failure with hypoxia: Secondary | ICD-10-CM | POA: Diagnosis not present

## 2019-03-06 DIAGNOSIS — Z6841 Body Mass Index (BMI) 40.0 and over, adult: Secondary | ICD-10-CM | POA: Diagnosis not present

## 2019-03-07 DIAGNOSIS — M545 Low back pain: Secondary | ICD-10-CM | POA: Diagnosis present

## 2019-03-07 DIAGNOSIS — G894 Chronic pain syndrome: Secondary | ICD-10-CM | POA: Diagnosis present

## 2019-03-07 DIAGNOSIS — I5020 Unspecified systolic (congestive) heart failure: Secondary | ICD-10-CM | POA: Diagnosis present

## 2019-03-07 DIAGNOSIS — Z79891 Long term (current) use of opiate analgesic: Secondary | ICD-10-CM | POA: Diagnosis not present

## 2019-03-07 DIAGNOSIS — I429 Cardiomyopathy, unspecified: Secondary | ICD-10-CM | POA: Diagnosis present

## 2019-03-07 DIAGNOSIS — R079 Chest pain, unspecified: Secondary | ICD-10-CM | POA: Diagnosis not present

## 2019-03-07 DIAGNOSIS — Z6841 Body Mass Index (BMI) 40.0 and over, adult: Secondary | ICD-10-CM | POA: Diagnosis not present

## 2019-03-07 DIAGNOSIS — R42 Dizziness and giddiness: Secondary | ICD-10-CM | POA: Diagnosis present

## 2019-03-07 DIAGNOSIS — J9611 Chronic respiratory failure with hypoxia: Secondary | ICD-10-CM | POA: Diagnosis present

## 2019-03-07 DIAGNOSIS — Z79899 Other long term (current) drug therapy: Secondary | ICD-10-CM | POA: Diagnosis not present

## 2019-03-07 DIAGNOSIS — R002 Palpitations: Secondary | ICD-10-CM | POA: Diagnosis present

## 2019-03-07 DIAGNOSIS — Z20822 Contact with and (suspected) exposure to covid-19: Secondary | ICD-10-CM | POA: Diagnosis present

## 2019-03-07 DIAGNOSIS — Z9884 Bariatric surgery status: Secondary | ICD-10-CM | POA: Diagnosis not present

## 2019-03-07 DIAGNOSIS — R0789 Other chest pain: Secondary | ICD-10-CM | POA: Diagnosis not present

## 2019-03-07 DIAGNOSIS — Z86718 Personal history of other venous thrombosis and embolism: Secondary | ICD-10-CM | POA: Diagnosis not present

## 2019-03-07 DIAGNOSIS — I252 Old myocardial infarction: Secondary | ICD-10-CM | POA: Diagnosis not present

## 2019-03-07 DIAGNOSIS — E662 Morbid (severe) obesity with alveolar hypoventilation: Secondary | ICD-10-CM | POA: Diagnosis present

## 2019-03-07 DIAGNOSIS — I110 Hypertensive heart disease with heart failure: Secondary | ICD-10-CM | POA: Diagnosis present

## 2019-03-07 DIAGNOSIS — Z8673 Personal history of transient ischemic attack (TIA), and cerebral infarction without residual deficits: Secondary | ICD-10-CM | POA: Diagnosis not present

## 2019-04-09 ENCOUNTER — Other Ambulatory Visit: Payer: Self-pay | Admitting: Family Medicine

## 2019-04-09 DIAGNOSIS — F444 Conversion disorder with motor symptom or deficit: Secondary | ICD-10-CM

## 2019-04-09 DIAGNOSIS — M5442 Lumbago with sciatica, left side: Secondary | ICD-10-CM

## 2019-04-09 DIAGNOSIS — F4323 Adjustment disorder with mixed anxiety and depressed mood: Secondary | ICD-10-CM

## 2019-04-09 DIAGNOSIS — G8929 Other chronic pain: Secondary | ICD-10-CM

## 2019-12-29 IMAGING — CR DG CERVICAL SPINE COMPLETE 4+V
6 series · 6 of 6 positions shown · non-contrast
Comparison: None.

CLINICAL DATA: Back pain.  Left-sided sciatica.

EXAM:
CERVICAL SPINE - COMPLETE 4+ VIEW

[c-spine lat]
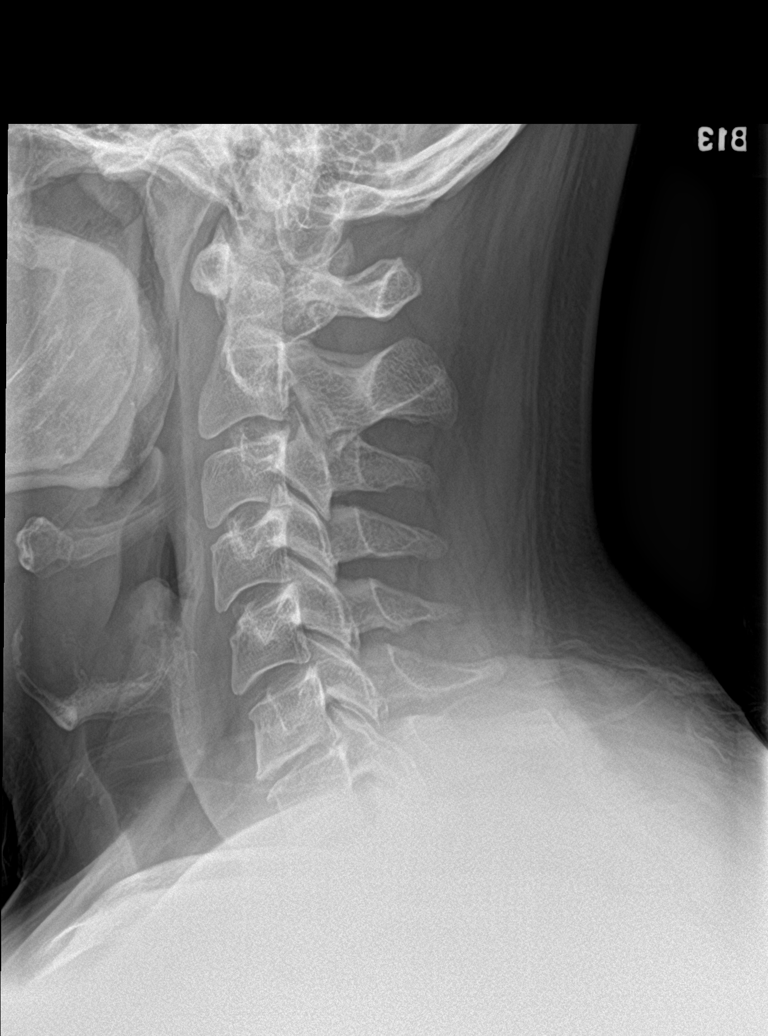

[c-spine obl (1 of 2)]
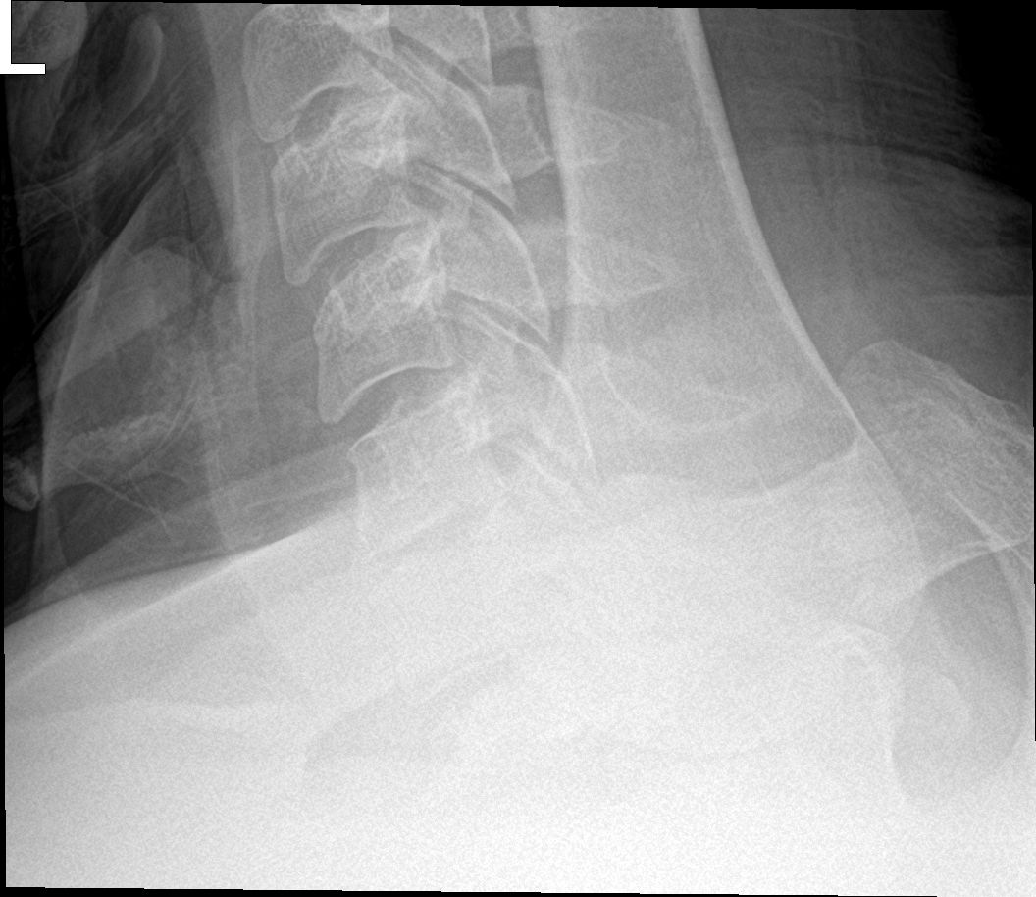

[c-spine obl (2 of 2)]
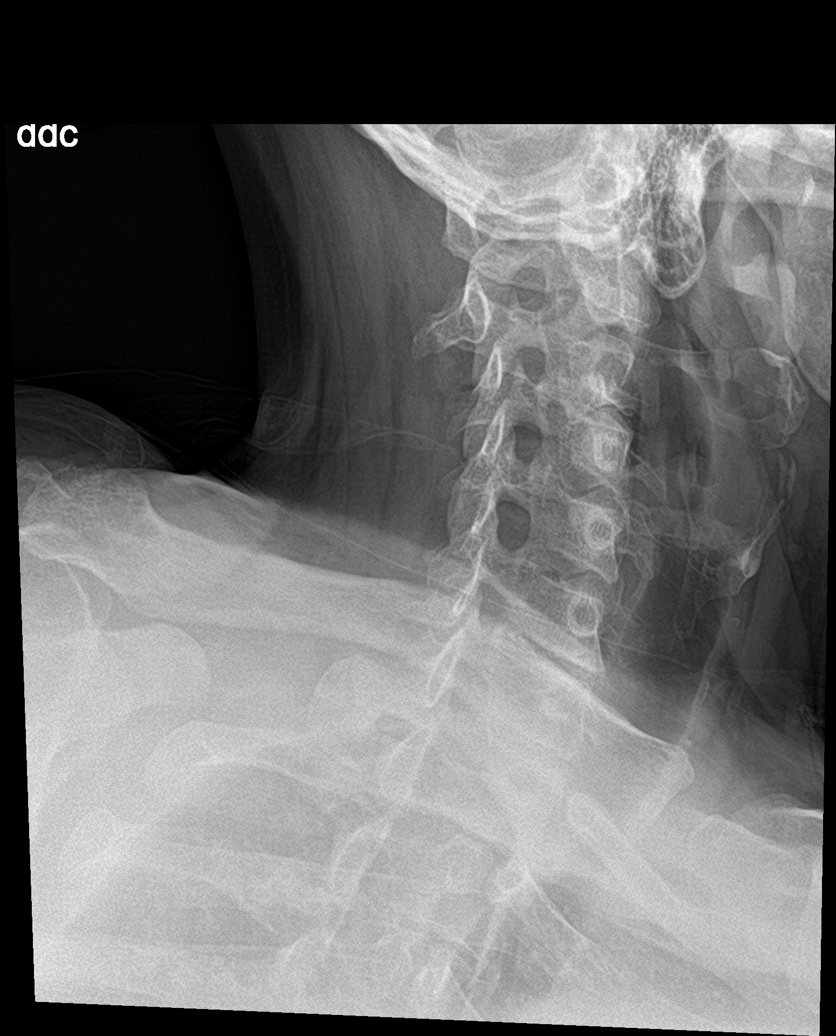

[c-spine ap (1 of 2)]
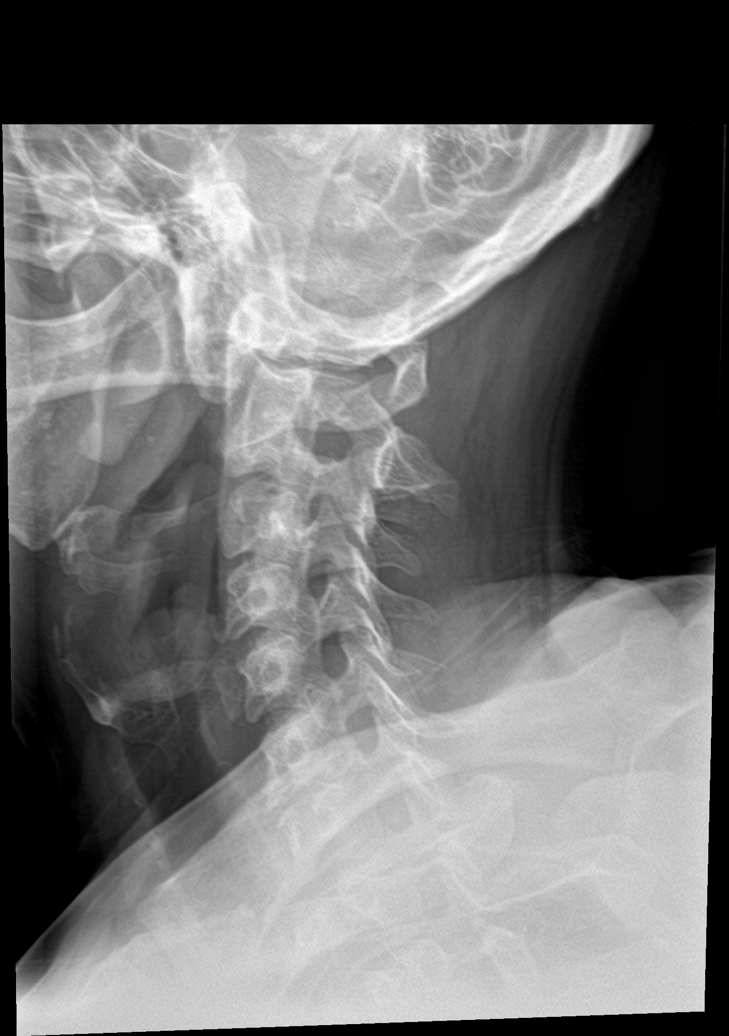

[c-spine open mouth]
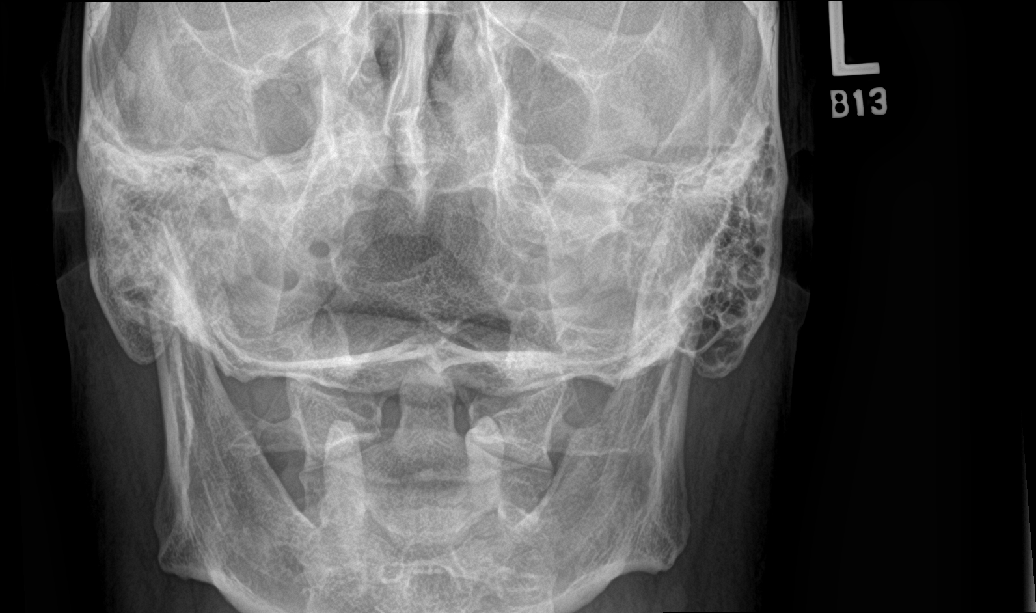

[c-spine ap (2 of 2)]
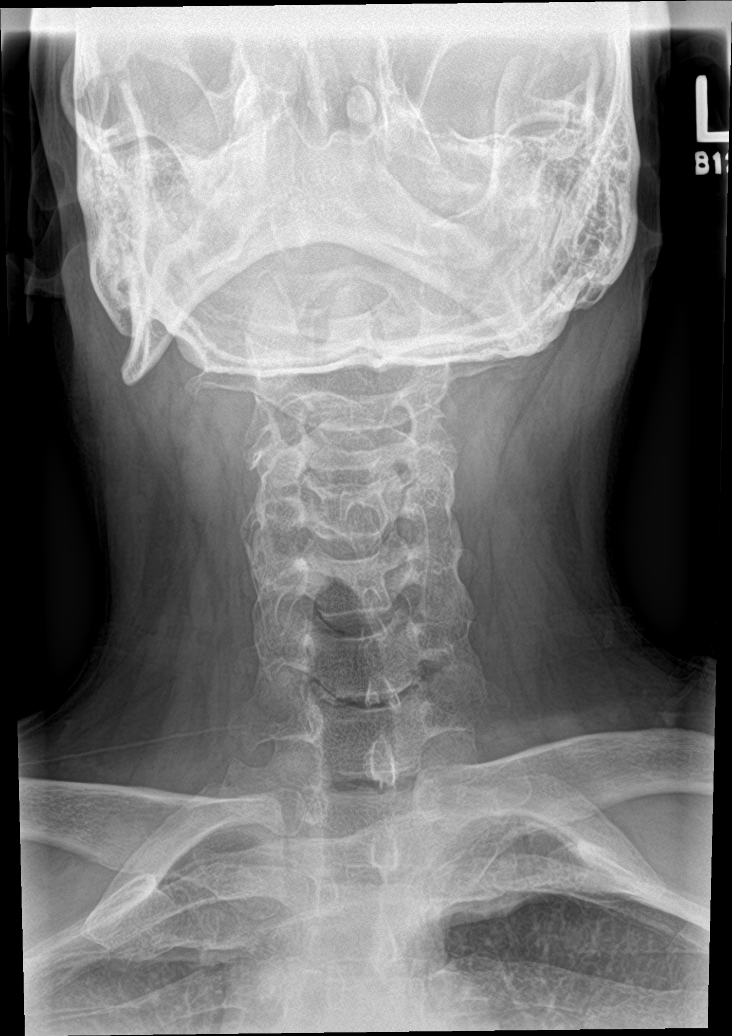

[6 of 6 positions shown; findings below may reference images not displayed]

FINDINGS: Alignment is normal. There is disc space narrowing at C6-7. Other
disc heights appear normal. No significant osteophytic encroachment
upon the canal or foramina. No evidence of facet arthropathy.
IMPRESSION: Spondylosis at C6-7.  Otherwise negative.
